# Patient Record
Sex: Male | Born: 1955 | Race: White | Hispanic: No | Marital: Married | Smoking: Former smoker
Health system: Southern US, Community
[De-identification: ages and names within clinical notes are randomized; demographics above are authoritative.]

## PROBLEM LIST (undated history)

## (undated) DIAGNOSIS — N529 Male erectile dysfunction, unspecified: Secondary | ICD-10-CM

## (undated) DIAGNOSIS — K219 Gastro-esophageal reflux disease without esophagitis: Secondary | ICD-10-CM

## (undated) DIAGNOSIS — F419 Anxiety disorder, unspecified: Secondary | ICD-10-CM

## (undated) DIAGNOSIS — I10 Essential (primary) hypertension: Secondary | ICD-10-CM

## (undated) HISTORY — DX: Gastro-esophageal reflux disease without esophagitis: K21.9

## (undated) HISTORY — PX: BACK SURGERY: SHX140

## (undated) HISTORY — DX: Male erectile dysfunction, unspecified: N52.9

## (undated) HISTORY — DX: Essential (primary) hypertension: I10

## (undated) HISTORY — DX: Anxiety disorder, unspecified: F41.9

---

## 2004-06-10 ENCOUNTER — Ambulatory Visit: Payer: Self-pay | Admitting: Unknown Physician Specialty

## 2008-12-04 ENCOUNTER — Ambulatory Visit: Payer: Self-pay | Admitting: Family Medicine

## 2009-03-08 ENCOUNTER — Ambulatory Visit: Payer: Self-pay | Admitting: Family Medicine

## 2009-04-21 ENCOUNTER — Ambulatory Visit: Payer: Self-pay | Admitting: Internal Medicine

## 2010-06-14 IMAGING — CR DG ANKLE COMPLETE 3+V*L*
1 series · 5 of 5 positions shown · non-contrast
Comparison: none

REASON FOR EXAM: r/o fx
COMMENTS:

[Series 1: view not recorded · 0.17mm/px · 5 of 5 slices shown]
[im 1/5]
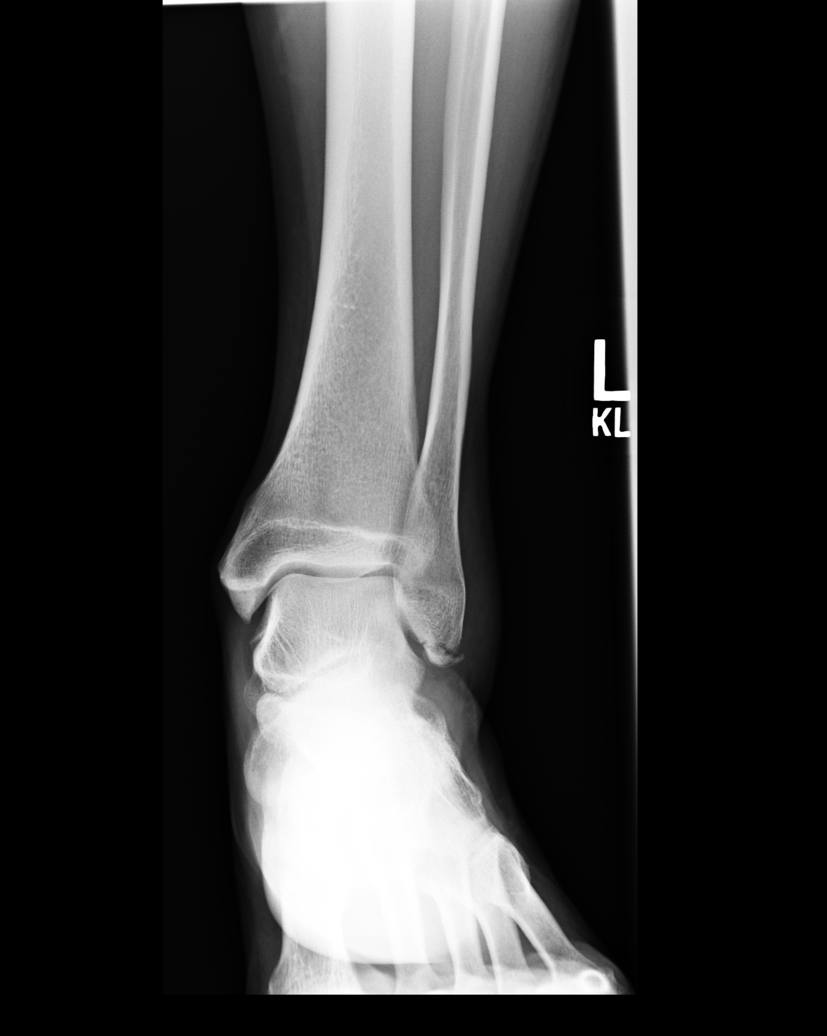
[im 2/5]
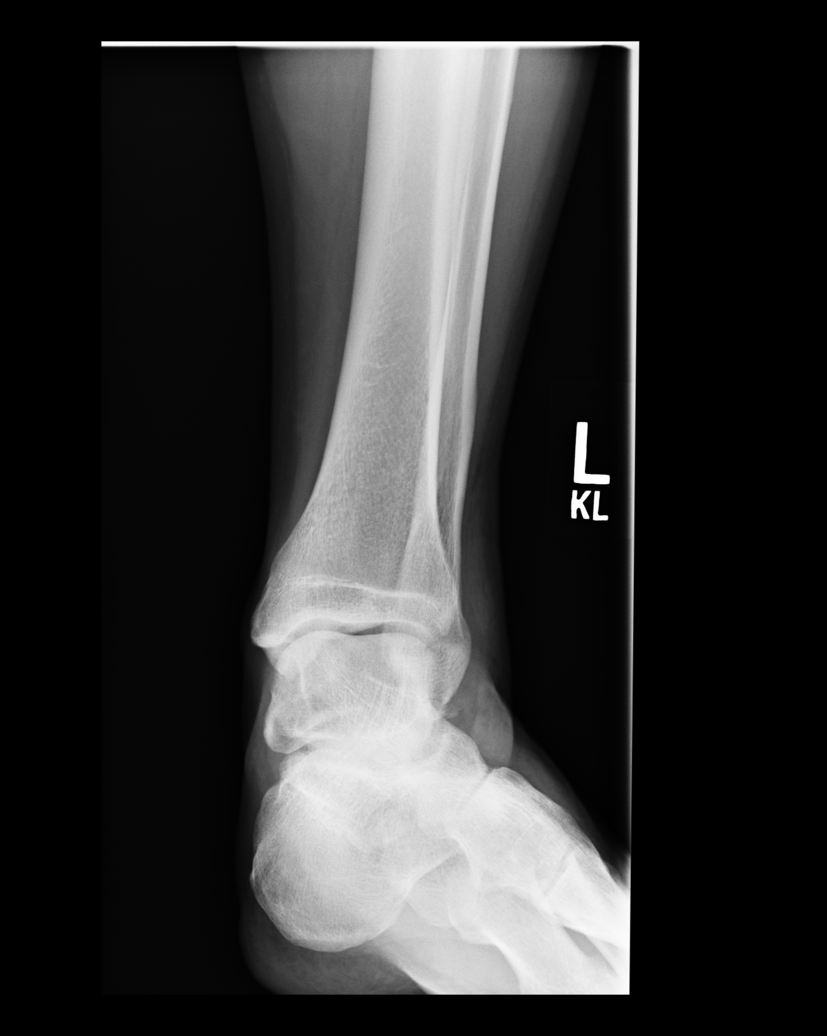
[im 3/5]
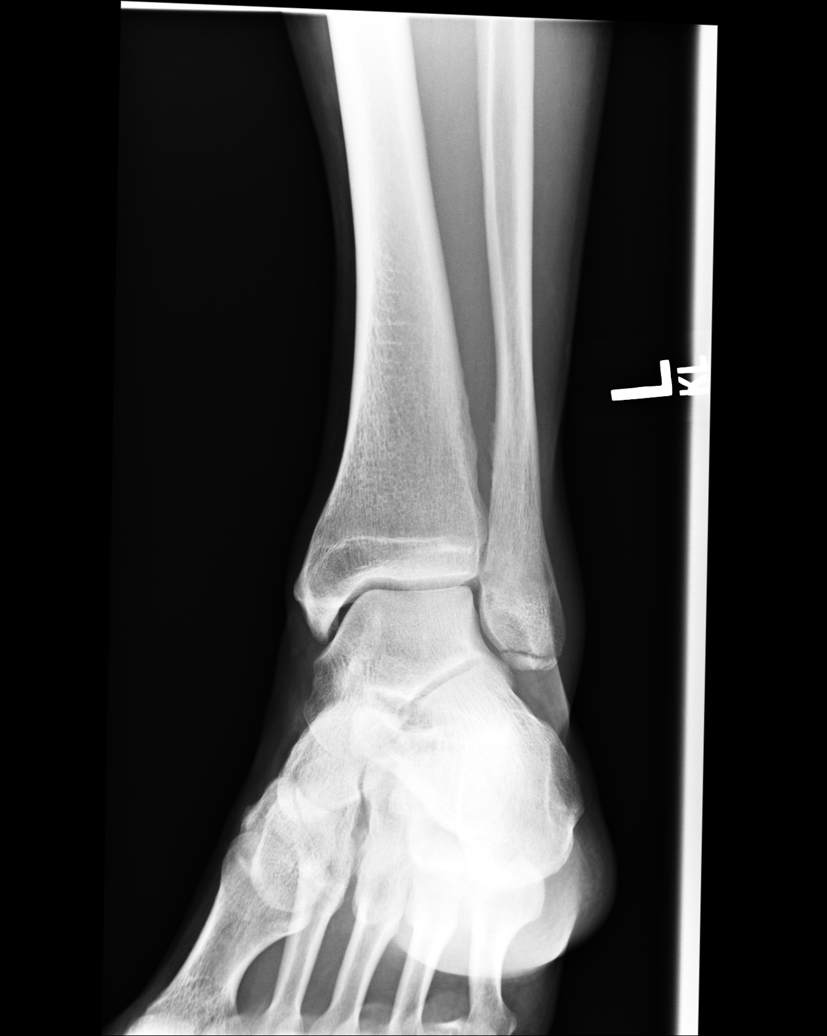
[im 4/5]
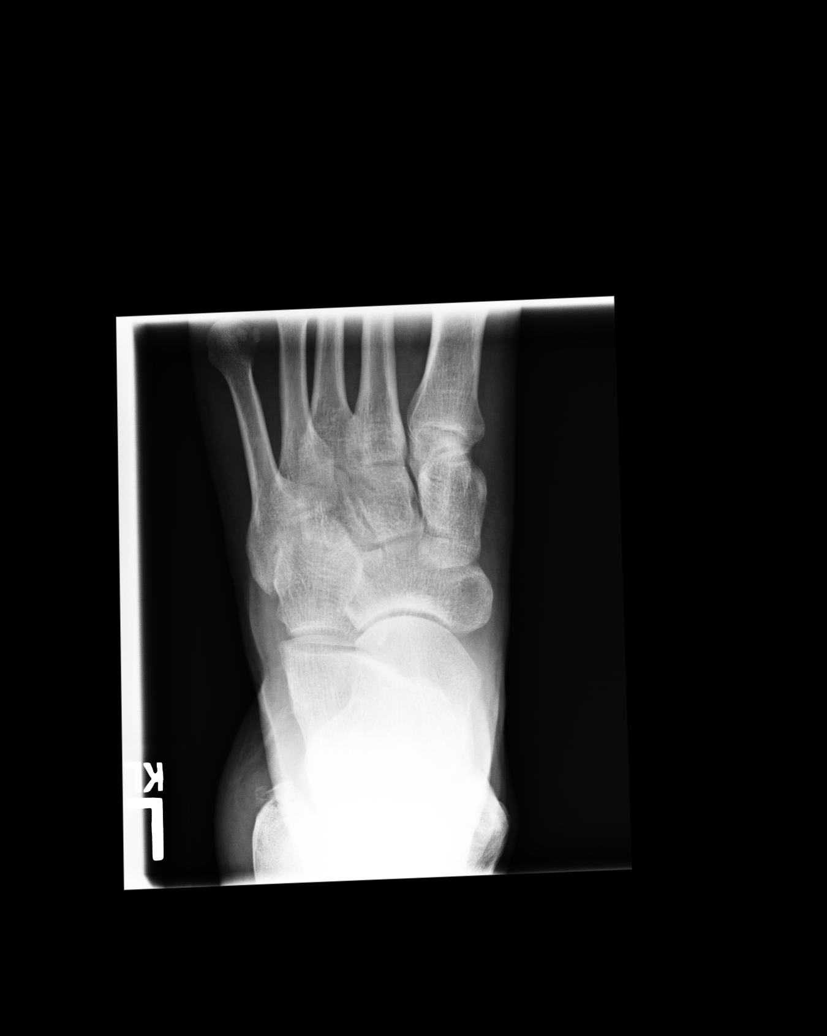
[im 5/5]
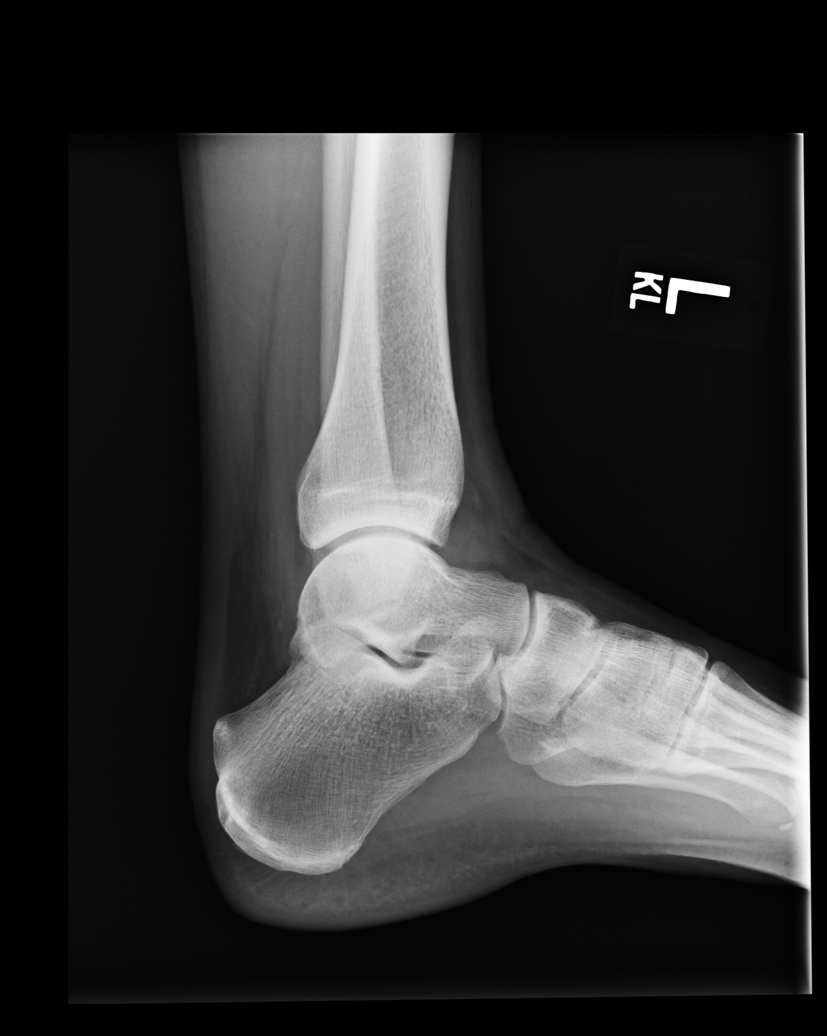

[5 of 5 positions shown; findings below may reference images not displayed]

PROCEDURE:     MDR - MDR ANKLE LEFT COMPLETE  - December 04, 2008 [DATE]

RESULT:     Five views of the left ankle are submitted. There is an acute
fracture through the tip of the lateral malleolus. Distraction of the
fracture fragment by approximately 2 mm is present. There is overlying soft
tissue swelling. The medial and posterior malleolar right are intact. The
ankle joint mortise is preserved and the talar dome is intact.
IMPRESSION: The patient has sustained an acute fracture through the tip
of the lateral malleolus.

## 2011-03-04 HISTORY — PX: COLONOSCOPY: SHX174

## 2011-08-02 LAB — HM COLONOSCOPY

## 2012-01-15 ENCOUNTER — Ambulatory Visit: Payer: Self-pay | Admitting: Unknown Physician Specialty

## 2012-01-19 LAB — PATHOLOGY REPORT

## 2014-11-01 ENCOUNTER — Other Ambulatory Visit: Payer: Self-pay | Admitting: Family Medicine

## 2014-11-01 DIAGNOSIS — N528 Other male erectile dysfunction: Secondary | ICD-10-CM

## 2014-12-14 ENCOUNTER — Encounter: Payer: Self-pay | Admitting: Family Medicine

## 2014-12-14 ENCOUNTER — Ambulatory Visit (INDEPENDENT_AMBULATORY_CARE_PROVIDER_SITE_OTHER): Payer: Commercial Managed Care - PPO | Admitting: Family Medicine

## 2014-12-14 VITALS — BP 100/60 | HR 80 | Ht 70.0 in | Wt 182.0 lb

## 2014-12-14 DIAGNOSIS — K219 Gastro-esophageal reflux disease without esophagitis: Secondary | ICD-10-CM | POA: Diagnosis not present

## 2014-12-14 DIAGNOSIS — Z720 Tobacco use: Secondary | ICD-10-CM | POA: Insufficient documentation

## 2014-12-14 DIAGNOSIS — N528 Other male erectile dysfunction: Secondary | ICD-10-CM

## 2014-12-14 DIAGNOSIS — G8929 Other chronic pain: Secondary | ICD-10-CM | POA: Insufficient documentation

## 2014-12-14 DIAGNOSIS — I10 Essential (primary) hypertension: Secondary | ICD-10-CM

## 2014-12-14 DIAGNOSIS — N529 Male erectile dysfunction, unspecified: Secondary | ICD-10-CM | POA: Diagnosis not present

## 2014-12-14 DIAGNOSIS — L649 Androgenic alopecia, unspecified: Secondary | ICD-10-CM

## 2014-12-14 DIAGNOSIS — Z86010 Personal history of colonic polyps: Secondary | ICD-10-CM | POA: Insufficient documentation

## 2014-12-14 DIAGNOSIS — M549 Dorsalgia, unspecified: Secondary | ICD-10-CM

## 2014-12-14 MED ORDER — LISINOPRIL-HYDROCHLOROTHIAZIDE 10-12.5 MG PO TABS: 1.0000 | ORAL_TABLET | Freq: Every day | ORAL | Status: DC

## 2014-12-14 MED ORDER — OMEPRAZOLE 20 MG PO CPDR: 20.0000 mg | DELAYED_RELEASE_CAPSULE | Freq: Every day | ORAL | Status: DC

## 2014-12-14 MED ORDER — TADALAFIL 20 MG PO TABS: 20.0000 mg | ORAL_TABLET | Freq: Every day | ORAL | Status: DC | PRN

## 2014-12-14 NOTE — Progress Notes (Signed)
Name: Bruce Joseph   MRN: [id number]    DOB: 1955/10/24   Date:12/14/2014       Progress Note  Subjective  Chief Complaint  Chief Complaint  Patient presents with  . Hypertension  . Erectile Dysfunction  . Gastrophageal Reflux    Hypertension This is a chronic problem. The current episode started more than 1 year ago. The problem has been gradually improving since onset. The problem is controlled. Pertinent negatives include no anxiety, blurred vision, chest pain, headaches, malaise/fatigue, neck pain, orthopnea, palpitations, peripheral edema, PND, shortness of breath or sweats. There are no associated agents to hypertension. Risk factors for coronary artery disease include male gender and stress. Past treatments include ACE inhibitors and diuretics. The current treatment provides moderate improvement. There are no compliance problems.  There is no history of angina, kidney disease, CAD/MI, CVA, heart failure, left ventricular hypertrophy, PVD, renovascular disease or retinopathy. There is no history of chronic renal disease.  Erectile Dysfunction This is a chronic problem. The problem has been waxing and waning since onset. The nature of his difficulty is achieving erection and maintaining erection. He reports no anxiety, decreased libido or performance anxiety. Irritative symptoms do not include frequency, nocturia or urgency. Obstructive symptoms do not include dribbling, incomplete emptying, an intermittent stream, a slower stream, straining or a weak stream. Pertinent negatives include no chills, dysuria, genital pain, hematuria, hesitancy or inability to urinate. Nothing aggravates the symptoms. Past treatments include tadalafil. The treatment provided moderate relief.  Gastrophageal Reflux He complains of belching and heartburn. He reports no abdominal pain, no chest pain, no coughing, no dysphagia, no nausea, no sore throat or no wheezing. This is a recurrent problem. The problem occurs  occasionally. The problem has been waxing and waning. The heartburn is of moderate intensity. The symptoms are aggravated by certain foods. Pertinent negatives include no anemia, fatigue, melena, muscle weakness, orthopnea or weight loss. The treatment provided mild relief.    No problem-specific assessment & plan notes found for this encounter.   Past Medical History  Diagnosis Date  . Erectile dysfunction   . GERD (gastroesophageal reflux disease)     Past Surgical History  Procedure Laterality Date  . Back surgery      lower back- disc repair  . Colonoscopy  2013    cleared for 5 yrs- Dr Mechele Collin    Family History  Problem Relation Age of Onset  . Hypertension Mother   . Hypertension Brother   . Hypertension Maternal Grandfather     Social History   Social History  . Marital Status: Married    Spouse Name: N/A  . Number of Children: N/A  . Years of Education: N/A   Occupational History  . Not on file.   Social History Main Topics  . Smoking status: Current Every Day Smoker  . Smokeless tobacco: Not on file  . Alcohol Use: 0.0 oz/week    0 Standard drinks or equivalent per week  . Drug Use: No  . Sexual Activity: Yes   Other Topics Concern  . Not on file   Social History Narrative  . No narrative on file    No Known Allergies   Review of Systems  Constitutional: Negative for fever, chills, weight loss, malaise/fatigue and fatigue.  HENT: Negative for ear discharge, ear pain and sore throat.   Eyes: Negative for blurred vision.  Respiratory: Negative for cough, sputum production, shortness of breath and wheezing.   Cardiovascular: Negative for chest pain, palpitations,  orthopnea, leg swelling and PND.  Gastrointestinal: Positive for heartburn. Negative for dysphagia, nausea, abdominal pain, diarrhea, constipation, blood in stool and melena.  Genitourinary: Negative for dysuria, hesitancy, urgency, frequency, hematuria, decreased libido, incomplete  emptying and nocturia.  Musculoskeletal: Negative for myalgias, back pain, joint pain, muscle weakness and neck pain.  Skin: Negative for rash.  Neurological: Negative for dizziness, tingling, sensory change, focal weakness and headaches.  Endo/Heme/Allergies: Negative for environmental allergies and polydipsia. Does not bruise/bleed easily.  Psychiatric/Behavioral: Negative for depression and suicidal ideas. The patient is not nervous/anxious and does not have insomnia.      Objective  Filed Vitals:   12/14/14 1344  BP: 100/60  Pulse: 80  Height: 5\' 10"  (1.778 m)  Weight: 182 lb (82.555 kg)    Physical Exam  Constitutional: He is oriented to person, place, and time and well-developed, well-nourished, and in no distress.  HENT:  Head: Normocephalic.  Right Ear: External ear normal.  Left Ear: External ear normal.  Nose: Nose normal.  Mouth/Throat: Oropharynx is clear and moist.  Eyes: Conjunctivae and EOM are normal. Pupils are equal, round, and reactive to light. Right eye exhibits no discharge. Left eye exhibits no discharge. No scleral icterus.  Neck: Normal range of motion. Neck supple. No JVD present. No tracheal deviation present. No thyromegaly present.  Cardiovascular: Normal rate, regular rhythm, normal heart sounds and intact distal pulses.  Exam reveals no gallop and no friction rub.   No murmur heard. Pulmonary/Chest: Breath sounds normal. No respiratory distress. He has no wheezes. He has no rales.  Abdominal: Soft. Bowel sounds are normal. He exhibits no mass. There is no hepatosplenomegaly. There is no tenderness. There is no rebound, no guarding and no CVA tenderness.  Musculoskeletal: Normal range of motion. He exhibits no edema or tenderness.  Lymphadenopathy:    He has no cervical adenopathy.  Neurological: He is alert and oriented to person, place, and time. He has normal sensation, normal strength, normal reflexes and intact cranial nerves. No cranial nerve  deficit.  Skin: Skin is warm. No rash noted.  Psychiatric: Mood and affect normal.      Assessment & Plan  Problem List Items Addressed This Visit      Cardiovascular and Mediastinum   Essential hypertension - Primary   Relevant Medications   tadalafil (CIALIS) 20 MG tablet   lisinopril-hydrochlorothiazide (PRINZIDE,ZESTORETIC) 10-12.5 MG tablet   Other Relevant Orders   Renal Function Panel     Digestive   Esophageal reflux   Relevant Medications   omeprazole (PRILOSEC) 20 MG capsule     Endocrine   Male pattern baldness     Genitourinary   Erectile dysfunction   Relevant Medications   tadalafil (CIALIS) 20 MG tablet     Other   Failure of erection   Current tobacco use        Dr. Hayden Rasmusseneanna Jones [facility]  12/14/2014

## 2014-12-15 LAB — RENAL FUNCTION PANEL
ALBUMIN: 4.2 g/dL (ref 3.5–5.5)
BUN / CREAT RATIO: 11 (ref 9–20)
BUN: 9 mg/dL (ref 6–24)
CHLORIDE: 95 mmol/L — AB (ref 97–108)
CO2: 22 mmol/L (ref 18–29)
Calcium: 8.8 mg/dL (ref 8.7–10.2)
Creatinine, Ser: 0.85 mg/dL (ref 0.76–1.27)
GFR, EST AFRICAN AMERICAN: 111 mL/min/{1.73_m2} (ref >59)
GFR, EST NON AFRICAN AMERICAN: 96 mL/min/{1.73_m2} (ref >59)
GLUCOSE: 104 mg/dL — AB (ref 65–99)
POTASSIUM: 3.8 mmol/L (ref 3.5–5.2)
Phosphorus: 3.2 mg/dL (ref 2.5–4.5)
SODIUM: 137 mmol/L (ref 134–144)

## 2015-03-30 ENCOUNTER — Ambulatory Visit (INDEPENDENT_AMBULATORY_CARE_PROVIDER_SITE_OTHER): Payer: Commercial Managed Care - PPO | Admitting: Family Medicine

## 2015-03-30 ENCOUNTER — Encounter: Payer: Self-pay | Admitting: Family Medicine

## 2015-03-30 VITALS — BP 120/98 | HR 80 | Ht 70.0 in | Wt 187.0 lb

## 2015-03-30 DIAGNOSIS — F419 Anxiety disorder, unspecified: Secondary | ICD-10-CM

## 2015-03-30 MED ORDER — BUSPIRONE HCL 7.5 MG PO TABS: 7.5000 mg | ORAL_TABLET | Freq: Two times a day (BID) | ORAL | Status: DC

## 2015-03-30 MED ORDER — ALPRAZOLAM 0.25 MG PO TABS: 0.2500 mg | ORAL_TABLET | Freq: Two times a day (BID) | ORAL | Status: DC | PRN

## 2015-03-30 NOTE — Progress Notes (Signed)
Name: Bruce Joseph   MRN: [id number]    DOB: Dec 19, 1955   Date:03/30/2015       Progress Note  Subjective  Chief Complaint  Chief Complaint  Patient presents with  . Anxiety    get started on something temporarily until getting in with psychiatry    Anxiety Presents for initial visit. Onset was more than 5 years ago. The problem has been gradually worsening. Symptoms include excessive worry, insomnia, nervous/anxious behavior and panic. Patient reports no chest pain, dizziness, nausea, palpitations, shortness of breath or suicidal ideas. Symptoms occur constantly. The severity of symptoms is moderate. The symptoms are aggravated by work stress and family issues. The quality of sleep is poor. Nighttime awakenings: occasional.   There are no known risk factors. There is no history of anemia, anxiety/panic attacks, arrhythmia, asthma, bipolar disorder, CAD, CHF, chronic lung disease, depression, fibromyalgia, hyperthyroidism or suicide attempts. Past treatments include SSRIs. The treatment provided mild relief. Compliance with prior treatments has been good. Side effects of treatment include GI discomfort.    No problem-specific assessment & plan notes found for this encounter.   Past Medical History  Diagnosis Date  . Erectile dysfunction   . GERD (gastroesophageal reflux disease)     Past Surgical History  Procedure Laterality Date  . Back surgery      lower back- disc repair  . Colonoscopy  2013    cleared for 5 yrs- Dr Mechele Collin    Family History  Problem Relation Age of Onset  . Hypertension Mother   . Hypertension Brother   . Hypertension Maternal Grandfather     Social History   Social History  . Marital Status: Married    Spouse Name: N/A  . Number of Children: N/A  . Years of Education: N/A   Occupational History  . Not on file.   Social History Main Topics  . Smoking status: Current Every Day Smoker  . Smokeless tobacco: Not on file  . Alcohol Use: 0.0  oz/week    0 Standard drinks or equivalent per week  . Drug Use: No  . Sexual Activity: Yes   Other Topics Concern  . Not on file   Social History Narrative    No Known Allergies   Review of Systems  Constitutional: Negative for fever, chills, weight loss and malaise/fatigue.  HENT: Negative for ear discharge, ear pain and sore throat.   Eyes: Negative for blurred vision.  Respiratory: Negative for cough, sputum production, shortness of breath and wheezing.   Cardiovascular: Negative for chest pain, palpitations and leg swelling.  Gastrointestinal: Negative for heartburn, nausea, abdominal pain, diarrhea, constipation, blood in stool and melena.  Genitourinary: Negative for dysuria, urgency, frequency and hematuria.  Musculoskeletal: Negative for myalgias, back pain, joint pain and neck pain.  Skin: Negative for rash.  Neurological: Negative for dizziness, tingling, sensory change, focal weakness and headaches.  Endo/Heme/Allergies: Negative for environmental allergies and polydipsia. Does not bruise/bleed easily.  Psychiatric/Behavioral: Negative for depression and suicidal ideas. The patient is nervous/anxious and has insomnia.      Objective  Filed Vitals:   03/30/15 1032  BP: 120/98  Pulse: 80  Height:  (1.778 m)  Weight: 187 lb (84.823 kg)    Physical Exam  Constitutional: He is oriented to person, place, and time and well-developed, well-nourished, and in no distress.  HENT:  Head: Normocephalic.  Right Ear: External ear normal.  Left Ear: External ear normal.  Nose: Nose normal.  Mouth/Throat: Oropharynx is clear  and moist.  Eyes: Conjunctivae and EOM are normal. Pupils are equal, round, and reactive to light. Right eye exhibits no discharge. Left eye exhibits no discharge. No scleral icterus.  Neck: Normal range of motion. Neck supple. No JVD present. No tracheal deviation present. No thyromegaly present.  Cardiovascular: Normal rate, regular rhythm,  normal heart sounds and intact distal pulses.  Exam reveals no gallop and no friction rub.   No murmur heard. Pulmonary/Chest: Breath sounds normal. No respiratory distress. He has no wheezes. He has no rales.  Abdominal: Soft. Bowel sounds are normal. He exhibits no mass. There is no hepatosplenomegaly. There is no tenderness. There is no rebound, no guarding and no CVA tenderness.  Musculoskeletal: Normal range of motion. He exhibits no edema or tenderness.  Lymphadenopathy:    He has no cervical adenopathy.  Neurological: He is alert and oriented to person, place, and time. He has normal sensation, normal strength, normal reflexes and intact cranial nerves. No cranial nerve deficit.  Skin: Skin is warm. No rash noted.  Psychiatric: Mood and affect normal.  Nursing note and vitals reviewed.     Assessment & Plan  Problem List Items Addressed This Visit    None    Visit Diagnoses    Chronic anxiety    -  Primary    referral psychiatry    Relevant Medications    busPIRone (BUSPAR) 7.5 MG tablet    ALPRAZolam (XANAX) 0.25 MG tablet      Gave information to pt for [employer] in [place]   Dr. Hayden [facility]  03/30/2015

## 2015-05-23 ENCOUNTER — Other Ambulatory Visit: Payer: Self-pay | Admitting: Family Medicine

## 2015-06-14 ENCOUNTER — Ambulatory Visit (INDEPENDENT_AMBULATORY_CARE_PROVIDER_SITE_OTHER): Payer: Commercial Managed Care - PPO | Admitting: Family Medicine

## 2015-06-14 ENCOUNTER — Other Ambulatory Visit: Payer: Self-pay | Admitting: Family Medicine

## 2015-06-14 ENCOUNTER — Encounter: Payer: Self-pay | Admitting: Family Medicine

## 2015-06-14 VITALS — BP 120/80 | HR 80 | Ht 70.0 in | Wt 181.0 lb

## 2015-06-14 DIAGNOSIS — K219 Gastro-esophageal reflux disease without esophagitis: Secondary | ICD-10-CM | POA: Diagnosis not present

## 2015-06-14 DIAGNOSIS — Z720 Tobacco use: Secondary | ICD-10-CM | POA: Diagnosis not present

## 2015-06-14 DIAGNOSIS — F419 Anxiety disorder, unspecified: Secondary | ICD-10-CM

## 2015-06-14 DIAGNOSIS — Z0000 Encounter for general adult medical examination without abnormal findings: Secondary | ICD-10-CM

## 2015-06-14 DIAGNOSIS — N400 Enlarged prostate without lower urinary tract symptoms: Secondary | ICD-10-CM | POA: Diagnosis not present

## 2015-06-14 DIAGNOSIS — N528 Other male erectile dysfunction: Secondary | ICD-10-CM | POA: Diagnosis not present

## 2015-06-14 DIAGNOSIS — I10 Essential (primary) hypertension: Secondary | ICD-10-CM | POA: Diagnosis not present

## 2015-06-14 DIAGNOSIS — E785 Hyperlipidemia, unspecified: Secondary | ICD-10-CM | POA: Diagnosis not present

## 2015-06-14 MED ORDER — OMEPRAZOLE 20 MG PO CPDR: 20.0000 mg | DELAYED_RELEASE_CAPSULE | Freq: Every day | ORAL | Status: DC

## 2015-06-14 MED ORDER — TADALAFIL 20 MG PO TABS: 20.0000 mg | ORAL_TABLET | Freq: Every day | ORAL | Status: DC | PRN

## 2015-06-14 MED ORDER — LISINOPRIL-HYDROCHLOROTHIAZIDE 10-12.5 MG PO TABS: 1.0000 | ORAL_TABLET | Freq: Every day | ORAL | Status: DC

## 2015-06-14 MED ORDER — BUSPIRONE HCL 7.5 MG PO TABS: ORAL_TABLET | ORAL | Status: DC

## 2015-06-14 NOTE — Progress Notes (Signed)
Name: Bruce Joseph   MRN: [id number]    DOB: [birth year]   Date:06/14/2015       Progress Note  Subjective  Chief Complaint  Chief Complaint  Patient presents with  . Annual Exam  . Hypertension  . Gastroesophageal Reflux  . Anxiety  . Erectile Dysfunction    HPI Comments: Patient presents for annual physical exam.  Hypertension This is a chronic problem. The current episode started more than 1 year ago. The problem has been gradually improving since onset. The problem is controlled. Associated symptoms include anxiety. Pertinent negatives include no blurred vision, chest pain, headaches, malaise/fatigue, neck pain, orthopnea, palpitations, peripheral edema, PND, shortness of breath or sweats. There are no associated agents to hypertension. There are no known risk factors for coronary artery disease. Past treatments include diuretics and ACE inhibitors. The current treatment provides moderate improvement. There are no compliance problems.  There is no history of angina, kidney disease, CAD/MI, CVA, heart failure, left ventricular hypertrophy, PVD, renovascular disease or retinopathy. There is no history of chronic renal disease or a hypertension causing med.  Gastroesophageal Reflux He reports no abdominal pain, no belching, no chest pain, no choking, no coughing, no dysphagia, no early satiety, no globus sensation, no heartburn, no hoarse voice, no nausea, no sore throat, no stridor, no tooth decay, no water brash or no wheezing. This is a chronic problem. The problem occurs occasionally. The problem has been gradually improving. The symptoms are aggravated by certain foods. Pertinent negatives include no anemia, fatigue, melena, muscle weakness, orthopnea or weight loss. Risk factors include smoking/tobacco exposure. He has tried a PPI for the symptoms. The treatment provided moderate relief.  Anxiety Presents for follow-up visit. The problem has been gradually improving. Symptoms include  nervous/anxious behavior. Patient reports no chest pain, compulsions, confusion, decreased concentration, depressed mood, dizziness, dry mouth, excessive worry, feeling of choking, hyperventilation, impotence, insomnia, irritability, malaise, muscle tension, nausea, obsessions, palpitations, panic, restlessness, shortness of breath or suicidal ideas. Symptoms occur occasionally. The severity of symptoms is mild. Nothing aggravates the symptoms. The quality of sleep is good.    Erectile Dysfunction This is a chronic problem. The problem has been waxing and waning since onset. Non-physiologic factors contributing to erectile dysfunction are anxiety. Irritative symptoms do not include frequency or urgency. Obstructive symptoms do not include dribbling, incomplete emptying, an intermittent stream, a slower stream, straining or a weak stream. Pertinent negatives include no chills, dysuria or hematuria. Nothing aggravates the symptoms. Past treatments include vardenafil.    No problem-specific assessment & plan notes found for this encounter.   Past Medical History  Diagnosis Date  . Erectile dysfunction   . GERD (gastroesophageal reflux disease)   . Hypertension   . Anxiety     Past Surgical History  Procedure Laterality Date  . Back surgery      lower back- disc repair  . Colonoscopy  2013    cleared for 5 yrs- Dr Mechele Collin    Family History  Problem Relation Age of Onset  . Hypertension Mother   . Hypertension Brother   . Hypertension Maternal Grandfather     Social History   Social History  . Marital Status: Married    Spouse Name: N/A  . Number of Children: N/A  . Years of Education: N/A   Occupational History  . Not on file.   Social History Main Topics  . Smoking status: Current Every Day Smoker  . Smokeless tobacco: Not on file  . Alcohol  Use: 0.0 oz/week    0 Standard drinks or equivalent per week  . Drug Use: No  . Sexual Activity: Yes   Other Topics Concern  .  Not on file   Social History Narrative    No Known Allergies   Review of Systems  Constitutional: Negative for fever, chills, weight loss, malaise/fatigue, irritability and fatigue.  HENT: Negative for ear discharge, ear pain, hoarse voice and sore throat.   Eyes: Negative for blurred vision.  Respiratory: Negative for cough, sputum production, choking, shortness of breath and wheezing.   Cardiovascular: Negative for chest pain, palpitations, orthopnea, leg swelling and PND.  Gastrointestinal: Negative for heartburn, dysphagia, nausea, abdominal pain, diarrhea, constipation, blood in stool and melena.  Genitourinary: Negative for dysuria, urgency, frequency, hematuria, impotence and incomplete emptying.  Musculoskeletal: Negative for myalgias, back pain, joint pain, muscle weakness and neck pain.  Skin: Negative for rash.  Neurological: Negative for dizziness, tingling, sensory change, focal weakness and headaches.  Endo/Heme/Allergies: Negative for environmental allergies and polydipsia. Does not bruise/bleed easily.  Psychiatric/Behavioral: Negative for depression, suicidal ideas, confusion and decreased concentration. The patient is nervous/anxious. The patient does not have insomnia.      Objective  Filed Vitals:   06/14/15 0904  BP: 120/80  Pulse: 80  Height: 5\' 10"  (1.778 m)  Weight: 181 lb (82.101 kg)    Physical Exam  Constitutional: He is oriented to person, place, and time and well-developed, well-nourished, and in no distress.  HENT:  Head: Normocephalic.  Right Ear: External ear normal.  Left Ear: External ear normal.  Nose: Nose normal.  Mouth/Throat: Oropharynx is clear and moist.  Eyes: Conjunctivae and EOM are normal. Pupils are equal, round, and reactive to light. Right eye exhibits no discharge. Left eye exhibits no discharge. No scleral icterus.  Neck: Normal range of motion. Neck supple. No JVD present. No tracheal deviation present. No thyromegaly  present.  Cardiovascular: Normal rate, regular rhythm, normal heart sounds and intact distal pulses.  Exam reveals no gallop and no friction rub.   No murmur heard. Pulmonary/Chest: Breath sounds normal. No respiratory distress. He has no wheezes. He has no rales.  Abdominal: Soft. Bowel sounds are normal. He exhibits no mass. There is no hepatosplenomegaly. There is no tenderness. There is no rebound, no guarding and no CVA tenderness.  Genitourinary: Rectum normal, prostate normal, testes/scrotum normal and penis normal. Guaiac negative stool.  Musculoskeletal: Normal range of motion. He exhibits no edema or tenderness.  Lymphadenopathy:    He has no cervical adenopathy.  Neurological: He is alert and oriented to person, place, and time. He has normal sensation, normal strength, normal reflexes and intact cranial nerves. No cranial nerve deficit.  Skin: Skin is warm. No rash noted.  Psychiatric: Mood and affect normal.  Nursing note and vitals reviewed.     Assessment & Plan  Problem List Items Addressed This Visit      Cardiovascular and Mediastinum   Essential hypertension   Relevant Medications   lisinopril-hydrochlorothiazide (PRINZIDE,ZESTORETIC) 10-12.5 MG tablet   tadalafil (CIALIS) 20 MG tablet   Other Relevant Orders   Renal Function Panel     Digestive   Esophageal reflux   Relevant Medications   omeprazole (PRILOSEC) 20 MG capsule     Genitourinary   Erectile dysfunction   Relevant Medications   tadalafil (CIALIS) 20 MG tablet     Other   Current tobacco use    Other Visit Diagnoses    Annual physical exam    -  Primary    BPH (benign prostatic hypertrophy)        Relevant Orders    PSA    Chronic anxiety        Relevant Medications    busPIRone (BUSPAR) 7.5 MG tablet    Hyperlipidemia        Relevant Medications    lisinopril-hydrochlorothiazide (PRINZIDE,ZESTORETIC) 10-12.5 MG tablet    tadalafil (CIALIS) 20 MG tablet    Other Relevant Orders     Lipid Profile         Dr. Elizabeth Sauer [facility]  06/14/2015

## 2015-06-15 LAB — RENAL FUNCTION PANEL
Albumin: 4.4 g/dL (ref 3.5–5.5)
BUN / CREAT RATIO: 11 (ref 9–20)
BUN: 10 mg/dL (ref 6–24)
CALCIUM: 8.8 mg/dL (ref 8.7–10.2)
CO2: 23 mmol/L (ref 18–29)
CREATININE: 0.94 mg/dL (ref 0.76–1.27)
Chloride: 96 mmol/L (ref 96–106)
GFR calc Af Amer: 103 mL/min/{1.73_m2} (ref >59)
GFR calc non Af Amer: 89 mL/min/{1.73_m2} (ref >59)
Glucose: 78 mg/dL (ref 65–99)
Phosphorus: 2.5 mg/dL (ref 2.5–4.5)
Potassium: 4.2 mmol/L (ref 3.5–5.2)
Sodium: 137 mmol/L (ref 134–144)

## 2015-06-15 LAB — LIPID PANEL
CHOL/HDL RATIO: 4 ratio (ref 0.0–5.0)
Cholesterol, Total: 206 mg/dL — ABNORMAL HIGH (ref 100–199)
HDL: 52 mg/dL (ref >39)
LDL Calculated: 120 mg/dL — ABNORMAL HIGH (ref 0–99)
TRIGLYCERIDES: 170 mg/dL — AB (ref 0–149)
VLDL CHOLESTEROL CAL: 34 mg/dL (ref 5–40)

## 2015-06-15 LAB — PSA: Prostate Specific Ag, Serum: 0.3 ng/mL (ref 0.0–4.0)

## 2015-09-17 ENCOUNTER — Telehealth: Payer: Self-pay

## 2015-09-17 DIAGNOSIS — I10 Essential (primary) hypertension: Secondary | ICD-10-CM

## 2015-09-17 DIAGNOSIS — K219 Gastro-esophageal reflux disease without esophagitis: Secondary | ICD-10-CM

## 2015-09-17 MED ORDER — OMEPRAZOLE 20 MG PO CPDR: 20.0000 mg | DELAYED_RELEASE_CAPSULE | Freq: Every day | ORAL | Status: DC

## 2015-09-17 MED ORDER — LISINOPRIL-HYDROCHLOROTHIAZIDE 10-12.5 MG PO TABS: 1.0000 | ORAL_TABLET | Freq: Every day | ORAL | Status: DC

## 2015-09-17 NOTE — Telephone Encounter (Signed)
90 supply requested, refilled and sent to the pharmacy.

## 2015-09-26 ENCOUNTER — Other Ambulatory Visit: Payer: Self-pay | Admitting: Family Medicine

## 2015-10-29 ENCOUNTER — Other Ambulatory Visit: Payer: Self-pay | Admitting: Family Medicine

## 2015-11-30 ENCOUNTER — Other Ambulatory Visit: Payer: Self-pay | Admitting: Family Medicine

## 2016-03-11 ENCOUNTER — Ambulatory Visit: Payer: Commercial Managed Care - PPO | Admitting: Family Medicine

## 2016-03-11 ENCOUNTER — Encounter: Payer: Self-pay | Admitting: Family Medicine

## 2016-03-11 ENCOUNTER — Ambulatory Visit (INDEPENDENT_AMBULATORY_CARE_PROVIDER_SITE_OTHER): Payer: Commercial Managed Care - PPO | Admitting: Family Medicine

## 2016-03-11 VITALS — BP 130/70 | HR 80 | Temp 97.8°F | Ht 70.0 in | Wt 192.0 lb

## 2016-03-11 DIAGNOSIS — J40 Bronchitis, not specified as acute or chronic: Secondary | ICD-10-CM

## 2016-03-11 DIAGNOSIS — J0100 Acute maxillary sinusitis, unspecified: Secondary | ICD-10-CM

## 2016-03-11 MED ORDER — AMOXICILLIN 500 MG PO CAPS: 500.0000 mg | ORAL_CAPSULE | Freq: Three times a day (TID) | ORAL | 0 refills | Status: DC

## 2016-03-11 MED ORDER — GUAIFENESIN-CODEINE 100-10 MG/5ML PO SYRP: 5.0000 mL | ORAL_SOLUTION | Freq: Three times a day (TID) | ORAL | 0 refills | Status: DC | PRN

## 2016-03-11 NOTE — Progress Notes (Signed)
Name: Brita RompRennie J Viglione   MRN: [id number]    DOB: 1955/09/22   Date:03/11/2016       Progress Note  Subjective  Chief Complaint  Chief Complaint  Patient presents with  . Sinusitis    dry cough with some green production, facial pressure and L) ear pain    Sinusitis  This is a chronic problem. The current episode started in the past 7 days. The problem has been gradually worsening since onset. The maximum temperature recorded prior to his arrival was 100.4 - 100.9 F. He is experiencing no pain. Associated symptoms include chills, congestion, coughing, diaphoresis, headaches, sinus pressure, sneezing and a sore throat. Pertinent negatives include no ear pain, hoarse voice, neck pain, shortness of breath or swollen glands. Past treatments include acetaminophen and oral decongestants. The treatment provided no relief.  Cough  This is a chronic problem. The current episode started in the past 7 days. The problem has been waxing and waning. The cough is productive of purulent sputum. Associated symptoms include chills, headaches, nasal congestion, postnasal drip, rhinorrhea and a sore throat. Pertinent negatives include no chest pain, ear pain, fever, heartburn, myalgias, rash, shortness of breath, weight loss or wheezing. The treatment provided mild relief. There is no history of environmental allergies.    No problem-specific Assessment & Plan notes found for this encounter.   Past Medical History:  Diagnosis Date  . Anxiety   . Erectile dysfunction   . GERD (gastroesophageal reflux disease)   . Hypertension     Past Surgical History:  Procedure Laterality Date  . BACK SURGERY     lower back- disc repair  . COLONOSCOPY  2013   cleared for 5 yrs- Dr Mechele CollinElliott    Family History  Problem Relation Age of Onset  . Hypertension Mother   . Hypertension Brother   . Hypertension Maternal Grandfather     Social History   Social History  . Marital status: Married    Spouse name: N/A  .  Number of children: N/A  . Years of education: N/A   Occupational History  . Not on file.   Social History Main Topics  . Smoking status: Current Every Day Smoker  . Smokeless tobacco: Not on file  . Alcohol use 0.0 oz/week  . Drug use: No  . Sexual activity: Yes   Other Topics Concern  . Not on file   Social History Narrative  . No narrative on file    No Known Allergies   Review of Systems  Constitutional: Positive for chills and diaphoresis. Negative for fever, malaise/fatigue and weight loss.  HENT: Positive for congestion, postnasal drip, rhinorrhea, sinus pressure, sneezing and sore throat. Negative for ear discharge, ear pain and hoarse voice.   Eyes: Negative for blurred vision.  Respiratory: Positive for cough. Negative for sputum production, shortness of breath and wheezing.   Cardiovascular: Negative for chest pain, palpitations and leg swelling.  Gastrointestinal: Negative for abdominal pain, blood in stool, constipation, diarrhea, heartburn, melena and nausea.  Genitourinary: Negative for dysuria, frequency, hematuria and urgency.  Musculoskeletal: Negative for back pain, joint pain, myalgias and neck pain.  Skin: Negative for rash.  Neurological: Positive for headaches. Negative for dizziness, tingling, sensory change and focal weakness.  Endo/Heme/Allergies: Negative for environmental allergies and polydipsia. Does not bruise/bleed easily.  Psychiatric/Behavioral: Negative for depression and suicidal ideas. The patient is not nervous/anxious and does not have insomnia.      Objective  Vitals:   03/11/16 1059  BP:  130/70  Pulse: 80  Temp: 97.8 F (36.6 C)  Weight: 192 lb (87.1 kg)  Height: 5\' 10"  (1.778 m)    Physical Exam  Constitutional: He is oriented to person, place, and time and well-developed, well-nourished, and in no distress.  HENT:  Head: Normocephalic.  Right Ear: External ear normal.  Left Ear: External ear normal.  Nose: Nose  normal.  Mouth/Throat: Oropharynx is clear and moist.  Eyes: Conjunctivae and EOM are normal. Pupils are equal, round, and reactive to light. Right eye exhibits no discharge. Left eye exhibits no discharge. No scleral icterus.  Neck: Normal range of motion. Neck supple. No JVD present. No tracheal deviation present. No thyromegaly present.  Cardiovascular: Normal rate, regular rhythm, normal heart sounds and intact distal pulses.  Exam reveals no gallop and no friction rub.   No murmur heard. Pulmonary/Chest: Breath sounds normal. No respiratory distress. He has no wheezes. He has no rales.  Abdominal: Soft. Bowel sounds are normal. He exhibits no mass. There is no hepatosplenomegaly. There is no tenderness. There is no rebound, no guarding and no CVA tenderness.  Musculoskeletal: Normal range of motion. He exhibits no edema or tenderness.  Lymphadenopathy:    He has no cervical adenopathy.  Neurological: He is alert and oriented to person, place, and time. He has normal sensation, normal strength, normal reflexes and intact cranial nerves. No cranial nerve deficit.  Skin: Skin is warm. No rash noted.  Psychiatric: Mood and affect normal.  Nursing note and vitals reviewed.     Assessment & Plan  Problem List Items Addressed This Visit    None    Visit Diagnoses    Acute maxillary sinusitis, recurrence not specified    -  Primary   Relevant Medications   amoxicillin (AMOXIL) 500 MG capsule   guaiFENesin-codeine (ROBITUSSIN AC) 100-10 MG/5ML syrup   Bronchitis       Relevant Medications   amoxicillin (AMOXIL) 500 MG capsule        Dr. [facility]  03/11/16

## 2016-03-14 ENCOUNTER — Other Ambulatory Visit: Payer: Self-pay | Admitting: Family Medicine

## 2016-03-14 DIAGNOSIS — I10 Essential (primary) hypertension: Secondary | ICD-10-CM

## 2016-03-17 ENCOUNTER — Other Ambulatory Visit: Payer: Self-pay | Admitting: Family Medicine

## 2016-03-17 DIAGNOSIS — K219 Gastro-esophageal reflux disease without esophagitis: Secondary | ICD-10-CM

## 2016-03-20 ENCOUNTER — Other Ambulatory Visit: Payer: Self-pay | Admitting: Family Medicine

## 2016-03-28 ENCOUNTER — Other Ambulatory Visit: Payer: Self-pay

## 2016-03-28 DIAGNOSIS — J0100 Acute maxillary sinusitis, unspecified: Secondary | ICD-10-CM

## 2016-03-28 DIAGNOSIS — J40 Bronchitis, not specified as acute or chronic: Secondary | ICD-10-CM

## 2016-03-28 MED ORDER — AMOXICILLIN 500 MG PO CAPS: 500.0000 mg | ORAL_CAPSULE | Freq: Three times a day (TID) | ORAL | 0 refills | Status: DC

## 2016-05-10 ENCOUNTER — Other Ambulatory Visit: Payer: Self-pay | Admitting: Family Medicine

## 2016-05-31 ENCOUNTER — Other Ambulatory Visit: Payer: Self-pay | Admitting: Family Medicine

## 2016-05-31 DIAGNOSIS — K219 Gastro-esophageal reflux disease without esophagitis: Secondary | ICD-10-CM

## 2016-06-11 ENCOUNTER — Other Ambulatory Visit: Payer: Self-pay | Admitting: Family Medicine

## 2016-06-11 DIAGNOSIS — I10 Essential (primary) hypertension: Secondary | ICD-10-CM

## 2016-07-11 ENCOUNTER — Other Ambulatory Visit: Payer: Self-pay

## 2016-07-22 ENCOUNTER — Other Ambulatory Visit: Payer: Self-pay

## 2016-08-06 ENCOUNTER — Encounter: Payer: Self-pay | Admitting: Family Medicine

## 2016-08-06 ENCOUNTER — Ambulatory Visit (INDEPENDENT_AMBULATORY_CARE_PROVIDER_SITE_OTHER): Payer: Commercial Managed Care - PPO | Admitting: Family Medicine

## 2016-08-06 VITALS — BP 120/80 | HR 76 | Ht 70.0 in | Wt 184.0 lb

## 2016-08-06 DIAGNOSIS — Z0000 Encounter for general adult medical examination without abnormal findings: Secondary | ICD-10-CM | POA: Diagnosis not present

## 2016-08-06 DIAGNOSIS — S41111A Laceration without foreign body of right upper arm, initial encounter: Secondary | ICD-10-CM

## 2016-08-06 DIAGNOSIS — Z23 Encounter for immunization: Secondary | ICD-10-CM | POA: Diagnosis not present

## 2016-08-06 DIAGNOSIS — R69 Illness, unspecified: Secondary | ICD-10-CM

## 2016-08-06 LAB — POCT URINALYSIS DIPSTICK
Bilirubin, UA: NEGATIVE
Blood, UA: NEGATIVE
GLUCOSE UA: NEGATIVE
Ketones, UA: NEGATIVE
LEUKOCYTES UA: NEGATIVE
NITRITE UA: NEGATIVE
Protein, UA: NEGATIVE
Spec Grav, UA: 1.01 (ref 1.010–1.025)
UROBILINOGEN UA: 0.2 U/dL
pH, UA: 5 (ref 5.0–8.0)

## 2016-08-06 LAB — HEMOCCULT GUIAC POC 1CARD (OFFICE): FECAL OCCULT BLD: NEGATIVE

## 2016-08-06 NOTE — Progress Notes (Signed)
Name: Bruce Joseph   MRN: [id number]    DOB: [birth year]   Date:08/06/2016       Progress Note  Subjective  Chief Complaint  Chief Complaint  Patient presents with (61y)  . Annual Exam    up to date on colon- needs TDAP    Patient presents for annual physical exam. (61y)     No problem-specific Assessment & Plan notes found for this encounter.   Past Medical History:  Diagnosis Date  . Anxiety   . Erectile dysfunction   . GERD (gastroesophageal reflux disease)   . Hypertension     Past Surgical History:  Procedure Laterality Date  . BACK SURGERY     lower back- disc repair  . COLONOSCOPY  2013   cleared for 5 yrs- Dr Mechele Collin    Family History  Problem Relation Age of Onset  . Hypertension Mother   . Hypertension Brother   . Hypertension Maternal Grandfather     Social History   Social History  . Marital status: Married    Spouse name: N/A  . Number of children: N/A  . Years of education: N/A   Occupational History  . Not on file.   Social History Main Topics  . Smoking status: Current Every Day Smoker    Types: Cigarettes  . Smokeless tobacco: Former Neurosurgeon     Comment: gave info on smoking cessation options  . Alcohol use 0.0 oz/week  . Drug use: No  . Sexual activity: Yes   Other Topics Concern  . Not on file   Social History Narrative  . No narrative on file    No Known Allergies  Outpatient Medications Prior to Visit  Medication Sig Dispense Refill  . finasteride (PROSCAR) 5 MG tablet TAKE 1 TABLET BY MOUTH EVERY DAY 30 tablet 1  . lisinopril-hydrochlorothiazide (PRINZIDE,ZESTORETIC) 10-12.5 MG tablet TAKE 1 TABLET BY MOUTH DAILY. 30 tablet 0  . Multiple Vitamin (MULTI-VITAMINS) TABS Take 1 tablet by mouth daily.    Marland Kitchen omeprazole (PRILOSEC) 20 MG capsule TAKE 1 CAPSULE (20 MG TOTAL) BY MOUTH DAILY. 90 capsule 0  . tadalafil (CIALIS) 20 MG tablet Take 1 tablet (20 mg total) by mouth daily as needed. 5 tablet 11  . amoxicillin (AMOXIL) 500 MG capsule  Take 1 capsule (500 mg total) by mouth 3 (three) times daily. 30 capsule 0  . guaiFENesin-codeine (ROBITUSSIN AC) 100-10 MG/5ML syrup Take 5 mLs by mouth 3 (three) times daily as needed for cough. 150 mL 0   No facility-administered medications prior to visit.     Review of Systems  Constitutional: Negative for chills, fever, malaise/fatigue and weight loss.       Weight gain  HENT: Negative for ear discharge, ear pain and sore throat.   Eyes: Negative for blurred vision.  Respiratory: Negative for cough, sputum production, shortness of breath and wheezing.   Cardiovascular: Negative for chest pain, palpitations and leg swelling.  Gastrointestinal: Negative for abdominal pain, blood in stool, constipation, diarrhea, heartburn, melena and nausea.  Genitourinary: Negative for dysuria, frequency, hematuria and urgency.       Nocturia  Musculoskeletal: Negative for back pain, joint pain, myalgias and neck pain.  Skin: Negative for rash.  Neurological: Negative for dizziness, tingling, sensory change, focal weakness and headaches.  Endo/Heme/Allergies: Negative for environmental allergies and polydipsia. Does not bruise/bleed easily.  Psychiatric/Behavioral: Negative for depression and suicidal ideas. The patient is not nervous/anxious and does not have insomnia.      Objective  Vitals:  08/06/16 0836  BP: 120/80  Pulse: 76  Weight: 184 lb (83.5 kg)  Height: 5\' 10"  (1.778 m)    Physical Exam  Constitutional: He is oriented to person, place, and time and well-developed, well-nourished, and in no distress.  HENT:  Head: Normocephalic and atraumatic.  Right Ear: Tympanic membrane, external ear and ear canal normal.  Left Ear: Tympanic membrane, external ear and ear canal normal.  Nose: Nose normal.  Mouth/Throat: Uvula is midline, oropharynx is clear and moist and mucous membranes are normal. No oropharyngeal exudate, posterior oropharyngeal edema, posterior oropharyngeal erythema  or tonsillar abscesses.  Eyes: Conjunctivae, EOM and lids are normal. Pupils are equal, round, and reactive to light. Right eye exhibits no discharge. Left eye exhibits no discharge. No scleral icterus.  Fundoscopic exam:      The right eye shows no arteriolar narrowing, no AV nicking and no papilledema.       The left eye shows no arteriolar narrowing, no AV nicking and no papilledema.  Neck: Trachea normal and normal range of motion. Neck supple. Normal carotid pulses, no hepatojugular reflux and no JVD present. Carotid bruit is not present. No tracheal deviation present. No thyromegaly present.  Cardiovascular: Normal rate, regular rhythm, S1 normal, S2 normal, normal heart sounds, intact distal pulses and normal pulses.  PMI is not displaced.  Exam reveals no gallop, no S3, no S4 and no friction rub.   No murmur heard. Pulmonary/Chest: Effort normal and breath sounds normal. No respiratory distress. He has no wheezes. He has no rales. Right breast exhibits no inverted nipple, no mass, no nipple discharge, no skin change and no tenderness. Left breast exhibits no inverted nipple, no mass, no nipple discharge, no skin change and no tenderness. Breasts are symmetrical.  Abdominal: Soft. Normal aorta and bowel sounds are normal. He exhibits no mass. There is no hepatosplenomegaly. There is no tenderness. There is no rebound, no guarding and no CVA tenderness.  Genitourinary: Rectum normal, prostate normal, testes/scrotum normal and penis normal.  Musculoskeletal: Normal range of motion. He exhibits no edema or tenderness.       Cervical back: Normal.       Thoracic back: Normal.       Lumbar back: Normal.  Lymphadenopathy:       Head (right side): No submental and no submandibular adenopathy present.       Head (left side): No submental and no submandibular adenopathy present.    He has no cervical adenopathy.    He has no axillary adenopathy.  Neurological: He is alert and oriented to person,  place, and time. He has normal sensation, normal strength, normal reflexes and intact cranial nerves. No cranial nerve deficit.  Skin: Skin is warm and intact. No rash noted.  Psychiatric: Mood and affect normal.  Nursing note and vitals reviewed.     Assessment & Plan  Problem List Items Addressed This Visit    None    Visit Diagnoses    Annual physical exam    -  Primary   Relevant Orders   Renal Function Panel   Lipid Profile   POCT occult blood stool (Completed)   POCT urinalysis dipstick (Completed)   Taking medication for chronic disease       Relevant Orders   PSA   Need for Tdap vaccination       Relevant Orders   Tdap vaccine greater than or equal to 7yo IM (Completed)   Laceration of right upper extremity, initial encounter  Relevant Orders   Tdap vaccine greater than or equal to 7yo IM (Completed)      No orders of the defined types were placed in this encounter.     Dr. Hayden Rasmusseneanna Treyvonne Tata [facility]  08/06/16

## 2016-08-07 LAB — LIPID PANEL
CHOL/HDL RATIO: 4.9 ratio (ref 0.0–5.0)
Cholesterol, Total: 200 mg/dL — ABNORMAL HIGH (ref 100–199)
HDL: 41 mg/dL (ref >39)
LDL Calculated: 98 mg/dL (ref 0–99)
Triglycerides: 305 mg/dL — ABNORMAL HIGH (ref 0–149)
VLDL Cholesterol Cal: 61 mg/dL — ABNORMAL HIGH (ref 5–40)

## 2016-08-07 LAB — RENAL FUNCTION PANEL
Albumin: 4.4 g/dL (ref 3.5–5.5)
BUN/Creatinine Ratio: 10 (ref 9–20)
BUN: 10 mg/dL (ref 6–24)
CO2: 23 mmol/L (ref 18–29)
Calcium: 8.8 mg/dL (ref 8.7–10.2)
Chloride: 100 mmol/L (ref 96–106)
Creatinine, Ser: 1.05 mg/dL (ref 0.76–1.27)
GFR, EST AFRICAN AMERICAN: 89 mL/min/{1.73_m2} (ref >59)
GFR, EST NON AFRICAN AMERICAN: 77 mL/min/{1.73_m2} (ref >59)
GLUCOSE: 88 mg/dL (ref 65–99)
POTASSIUM: 4.1 mmol/L (ref 3.5–5.2)
Phosphorus: 2.3 mg/dL — ABNORMAL LOW (ref 2.5–4.5)
SODIUM: 137 mmol/L (ref 134–144)

## 2016-08-07 LAB — PSA: PROSTATE SPECIFIC AG, SERUM: 0.3 ng/mL (ref 0.0–4.0)

## 2016-08-09 ENCOUNTER — Other Ambulatory Visit: Payer: Self-pay | Admitting: Family Medicine

## 2016-08-09 DIAGNOSIS — N528 Other male erectile dysfunction: Secondary | ICD-10-CM

## 2016-08-10 ENCOUNTER — Other Ambulatory Visit: Payer: Self-pay | Admitting: Family Medicine

## 2016-08-10 DIAGNOSIS — I10 Essential (primary) hypertension: Secondary | ICD-10-CM

## 2016-08-11 ENCOUNTER — Other Ambulatory Visit: Payer: Self-pay

## 2016-08-11 DIAGNOSIS — N528 Other male erectile dysfunction: Secondary | ICD-10-CM

## 2016-08-11 MED ORDER — TADALAFIL 20 MG PO TABS: 20.0000 mg | ORAL_TABLET | Freq: Every day | ORAL | 11 refills | Status: DC | PRN

## 2016-08-21 ENCOUNTER — Other Ambulatory Visit: Payer: Self-pay | Admitting: Family Medicine

## 2016-08-21 DIAGNOSIS — I10 Essential (primary) hypertension: Secondary | ICD-10-CM

## 2016-08-26 ENCOUNTER — Other Ambulatory Visit: Payer: Self-pay | Admitting: Family Medicine

## 2016-08-26 DIAGNOSIS — K219 Gastro-esophageal reflux disease without esophagitis: Secondary | ICD-10-CM

## 2016-09-19 ENCOUNTER — Other Ambulatory Visit: Payer: Self-pay

## 2016-11-12 ENCOUNTER — Other Ambulatory Visit: Payer: Self-pay | Admitting: Family Medicine

## 2016-11-12 DIAGNOSIS — K219 Gastro-esophageal reflux disease without esophagitis: Secondary | ICD-10-CM

## 2016-12-24 ENCOUNTER — Other Ambulatory Visit: Payer: Self-pay | Admitting: Family Medicine

## 2016-12-24 DIAGNOSIS — I10 Essential (primary) hypertension: Secondary | ICD-10-CM

## 2017-04-09 ENCOUNTER — Other Ambulatory Visit: Payer: Self-pay

## 2017-04-10 ENCOUNTER — Ambulatory Visit: Payer: Commercial Managed Care - PPO | Admitting: Family Medicine

## 2017-04-10 ENCOUNTER — Encounter: Payer: Self-pay | Admitting: Family Medicine

## 2017-04-10 VITALS — BP 120/80 | HR 80 | Ht 70.0 in | Wt 183.0 lb

## 2017-04-10 DIAGNOSIS — M7522 Bicipital tendinitis, left shoulder: Secondary | ICD-10-CM

## 2017-04-10 DIAGNOSIS — M7592 Shoulder lesion, unspecified, left shoulder: Secondary | ICD-10-CM

## 2017-04-10 MED ORDER — MELOXICAM 15 MG PO TABS
15.0000 mg | ORAL_TABLET | Freq: Every day | ORAL | 0 refills | Status: DC
Start: 2017-04-10 — End: 2017-05-07

## 2017-04-10 NOTE — Patient Instructions (Signed)
Tendinitis Tendinitis is inflammation of a tendon. A tendon is a strong cord of tissue that connects muscle to bone. Tendinitis can affect any tendon, but it most commonly affects the shoulder tendon (rotator cuff), ankle tendon (Achilles tendon), elbow tendon (triceps tendon), or one of the tendons in the wrist. What are the causes? This condition may be caused by:  Overusing a tendon or muscle. This is common.  Age-related wear and tear.  Injury.  Inflammatory conditions, such as arthritis.  Certain medicines.  What increases the risk? This condition is more likely to develop in people who do activities that involve repetitive motions. What are the signs or symptoms? Symptoms of this condition may include:  Pain.  Tenderness.  Mild swelling.  How is this diagnosed? This condition is diagnosed with a physical exam. You may also have tests, such as:  Ultrasound. This uses sound waves to make an image of your affected area.  MRI.  How is this treated? This condition may be treated by resting, icing, applying pressure (compression), and raising (elevating) the area above the level of your heart. This is known as RICE therapy. Treatment may also include:  Medicines to help reduce inflammation or to help reduce pain.  Exercises or physical therapy to strengthen and stretch the tendon.  A brace or splint.  Surgery (rare).  Follow these instructions at home:  If you have a splint or brace:  Wear the splint or brace as told by your health care provider. Remove it only as told by your health care provider.  Loosen the splint or brace if your fingers or toes tingle, become numb, or turn cold and blue.  Do not take baths, swim, or use a hot tub until your health care provider approves. Ask your health care provider if you can take showers. You may only be allowed to take sponge baths for bathing.  Do not let your splint or brace get wet if it is not waterproof. ? If your  splint or brace is not waterproof, cover it with a watertight plastic bag when you take a bath or a shower.  Keep the splint or brace clean. Managing pain, stiffness, and swelling  If directed, apply ice to the affected area. ? Put ice in a plastic bag. ? Place a towel between your skin and the bag. ? Leave the ice on for 20 minutes, 2-3 times a day.  If directed, apply heat to the affected area as often as told by your health care provider. Use the heat source that your health care provider recommends, such as a moist heat pack or a heating pad. ? Place a towel between your skin and the heat source. ? Leave the heat on for 20-30 minutes. ? Remove the heat if your skin turns bright red. This is especially important if you are unable to feel pain, heat, or cold. You may have a greater risk of getting burned.  Move the fingers or toes of the affected limb often, if this applies. This can help to prevent stiffness and lessen swelling.  If directed, elevate the affected area above the level of your heart while you are sitting or lying down. Driving  Do not drive or operate heavy machinery while taking prescription pain medicine.  Ask your health care provider when it is safe to drive if you have a splint or brace on any part of your arm or leg. Activity  Return to your normal activities as told by your health care   provider. Ask your health care provider what activities are safe for you.  Rest the affected area as told by your health care provider.  Avoid using the affected area while you are experiencing symptoms of tendinitis.  Do exercises as told by your health care provider. General instructions  If you have a splint, do not put pressure on any part of the splint until it is fully hardened. This may take several hours.  Wear an elastic bandage or compression wrap only as told by your health care provider.  Take over-the-counter and prescription medicines only as told by your  health care provider.  Keep all follow-up visits as told by your health care provider. This is important. Contact a health care provider if:  Your symptoms do not improve.  You develop new, unexplained problems, such as numbness in your hands. This information is not intended to replace advice given to you by your health care provider. Make sure you discuss any questions you have with your health care provider. Document Released: 02/15/2000 Document Revised: 10/18/2015 Document Reviewed: 11/20/2014 Elsevier Interactive Patient Education  2018 [employer]  

## 2017-04-10 NOTE — Progress Notes (Signed)
Name: Bruce Joseph   MRN: [id number]    DOB: [birth year]   Date:04/10/2017       Progress Note  Subjective  Chief Complaint  Chief Complaint  Patient presents with  . Shoulder Pain    L) shoulder pain    Shoulder Pain   The pain is present in the left shoulder. This is a chronic problem. The current episode started more than 1 year ago. The problem occurs intermittently. The problem has been waxing and waning. The quality of the pain is described as aching. The pain is at a severity of 5/10. The pain is moderate. Associated symptoms include a limited range of motion and stiffness. Pertinent negatives include no fever, joint locking, joint swelling, numbness or tingling. The symptoms are aggravated by activity. He has tried NSAIDS for the symptoms. The treatment provided mild (eased off some) relief.    No problem-specific Assessment & Plan notes found for this encounter.   Past Medical History:  Diagnosis Date  . Anxiety   . Erectile dysfunction   . GERD (gastroesophageal reflux disease)   . Hypertension     Past Surgical History:  Procedure Laterality Date  . BACK SURGERY     lower back- disc repair  . COLONOSCOPY  2013   cleared for 5 yrs- Dr Mechele CollinElliott    Family History  Problem Relation Age of Onset  . Hypertension Mother   . Hypertension Brother   . Hypertension Maternal Grandfather     Social History   Socioeconomic History  . Marital status: Married    Spouse name: Not on file  . Number of children: Not on file  . Years of education: Not on file  . Highest education level: Not on file  Social Needs  . Financial resource strain: Not on file  . Food insecurity - worry: Not on file  . Food insecurity - inability: Not on file  . Transportation needs - medical: Not on file  . Transportation needs - non-medical: Not on file  Occupational History  . Not on file  Tobacco Use  . Smoking status: Current Every Day Smoker    Types: Cigarettes  . Smokeless tobacco:  Former NeurosurgeonUser  . Tobacco comment: gave info on smoking cessation options  Substance and Sexual Activity  . Alcohol use: Yes    Alcohol/week: 0.0 oz  . Drug use: No  . Sexual activity: Yes  Other Topics Concern  . Not on file  Social History Narrative  . Not on file    No Known Allergies  Outpatient Medications Prior to Visit  Medication Sig Dispense Refill  . finasteride (PROSCAR) 5 MG tablet TAKE 1 TABLET BY MOUTH EVERY DAY 30 tablet 5  . lisinopril-hydrochlorothiazide (PRINZIDE,ZESTORETIC) 10-12.5 MG tablet TAKE 1 TABLET BY MOUTH DAILY 90 tablet 0  . Multiple Vitamin (MULTI-VITAMINS) TABS Take 1 tablet by mouth daily.    Marland Kitchen. omeprazole (PRILOSEC) 20 MG capsule TAKE 1 CAPSULE (20 MG TOTAL) BY MOUTH DAILY. 90 capsule 0  . tadalafil (CIALIS) 20 MG tablet Take 1 tablet (20 mg total) by mouth daily as needed. 5 tablet 11   No facility-administered medications prior to visit.     Review of Systems  Constitutional: Negative for chills, fever, malaise/fatigue and weight loss.  HENT: Negative for ear discharge, ear pain and sore throat.   Eyes: Negative for blurred vision.  Respiratory: Negative for cough, sputum production, shortness of breath and wheezing.   Cardiovascular: Negative for chest pain, palpitations and leg  swelling.  Gastrointestinal: Negative for abdominal pain, blood in stool, constipation, diarrhea, heartburn, melena and nausea.  Genitourinary: Negative for dysuria, frequency, hematuria and urgency.  Musculoskeletal: Positive for stiffness. Negative for back pain, joint pain, myalgias and neck pain.  Skin: Negative for rash.  Neurological: Negative for dizziness, tingling, sensory change, focal weakness, numbness and headaches.  Endo/Heme/Allergies: Negative for environmental allergies and polydipsia. Does not bruise/bleed easily.  Psychiatric/Behavioral: Negative for depression and suicidal ideas. The patient is not nervous/anxious and does not have insomnia.       Objective  Vitals:   04/10/17 0847  BP: 120/80  Pulse: 80  Weight: 183 lb (83 kg)  Height: 5\' 10"  (1.778 m)    Physical Exam  Constitutional: He is oriented to person, place, and time and well-developed, well-nourished, and in no distress.  HENT:  Head: Normocephalic.  Right Ear: External ear normal.  Left Ear: External ear normal.  Nose: Nose normal.  Mouth/Throat: Oropharynx is clear and moist.  Eyes: Conjunctivae and EOM are normal. Pupils are equal, round, and reactive to light. Right eye exhibits no discharge. Left eye exhibits no discharge. No scleral icterus.  Neck: Normal range of motion. Neck supple. No JVD present. No tracheal deviation present. No thyromegaly present.  Cardiovascular: Normal rate, regular rhythm, normal heart sounds and intact distal pulses. Exam reveals no gallop and no friction rub.  No murmur heard. Pulmonary/Chest: Breath sounds normal. No respiratory distress. He has no wheezes. He has no rales.  Abdominal: Soft. Bowel sounds are normal. He exhibits no mass. There is no hepatosplenomegaly. There is no tenderness. There is no rebound, no guarding and no CVA tenderness.  Musculoskeletal: He exhibits no edema.       Left shoulder: He exhibits tenderness.  Tenderness/supraspinatis/bicps  Lymphadenopathy:    He has no cervical adenopathy.  Neurological: He is alert and oriented to person, place, and time. He has normal sensation, normal strength, normal reflexes and intact cranial nerves. No cranial nerve deficit.  Skin: Skin is warm. No rash noted.  Psychiatric: Mood and affect normal.  Nursing note and vitals reviewed.     Assessment & Plan  Problem List Items Addressed This Visit    None    Visit Diagnoses    Left supraspinatus tendinitis    -  Primary   Relevant Medications   meloxicam (MOBIC) 15 MG tablet   Other Relevant Orders   Ambulatory referral to Orthopedic Surgery   Biceps tendinitis of left upper extremity        Relevant Medications   meloxicam (MOBIC) 15 MG tablet   Other Relevant Orders   Ambulatory referral to Orthopedic Surgery      Meds ordered this encounter  Medications  . meloxicam (MOBIC) 15 MG tablet    Sig: Take 1 tablet (15 mg total) by mouth daily.    Dispense:  30 tablet    Refill:  0      Dr. [facility]  04/10/17

## 2017-05-07 ENCOUNTER — Other Ambulatory Visit: Payer: Self-pay | Admitting: Family Medicine

## 2017-05-07 DIAGNOSIS — M7522 Bicipital tendinitis, left shoulder: Secondary | ICD-10-CM

## 2017-05-07 DIAGNOSIS — M7592 Shoulder lesion, unspecified, left shoulder: Secondary | ICD-10-CM

## 2017-06-30 ENCOUNTER — Other Ambulatory Visit: Payer: Self-pay

## 2017-06-30 DIAGNOSIS — I10 Essential (primary) hypertension: Secondary | ICD-10-CM

## 2017-06-30 MED ORDER — LISINOPRIL-HYDROCHLOROTHIAZIDE 10-12.5 MG PO TABS: 1.0000 | ORAL_TABLET | Freq: Every day | ORAL | 0 refills | Status: DC

## 2017-08-13 ENCOUNTER — Other Ambulatory Visit: Payer: Self-pay | Admitting: Family Medicine

## 2017-08-13 DIAGNOSIS — K219 Gastro-esophageal reflux disease without esophagitis: Secondary | ICD-10-CM

## 2017-08-19 ENCOUNTER — Ambulatory Visit (INDEPENDENT_AMBULATORY_CARE_PROVIDER_SITE_OTHER): Payer: Commercial Managed Care - PPO | Admitting: Family Medicine

## 2017-08-19 ENCOUNTER — Encounter: Payer: Self-pay | Admitting: Family Medicine

## 2017-08-19 VITALS — BP 138/80 | HR 72 | Ht 70.0 in | Wt 189.0 lb

## 2017-08-19 DIAGNOSIS — N528 Other male erectile dysfunction: Secondary | ICD-10-CM

## 2017-08-19 DIAGNOSIS — Z0000 Encounter for general adult medical examination without abnormal findings: Secondary | ICD-10-CM

## 2017-08-19 DIAGNOSIS — K219 Gastro-esophageal reflux disease without esophagitis: Secondary | ICD-10-CM | POA: Diagnosis not present

## 2017-08-19 DIAGNOSIS — Z1211 Encounter for screening for malignant neoplasm of colon: Secondary | ICD-10-CM

## 2017-08-19 DIAGNOSIS — F17200 Nicotine dependence, unspecified, uncomplicated: Secondary | ICD-10-CM

## 2017-08-19 DIAGNOSIS — E785 Hyperlipidemia, unspecified: Secondary | ICD-10-CM | POA: Diagnosis not present

## 2017-08-19 DIAGNOSIS — L649 Androgenic alopecia, unspecified: Secondary | ICD-10-CM | POA: Diagnosis not present

## 2017-08-19 DIAGNOSIS — Z0001 Encounter for general adult medical examination with abnormal findings: Secondary | ICD-10-CM

## 2017-08-19 DIAGNOSIS — N400 Enlarged prostate without lower urinary tract symptoms: Secondary | ICD-10-CM

## 2017-08-19 DIAGNOSIS — I10 Essential (primary) hypertension: Secondary | ICD-10-CM | POA: Diagnosis not present

## 2017-08-19 LAB — HEMOCCULT GUIAC POC 1CARD (OFFICE): Fecal Occult Blood, POC: NEGATIVE

## 2017-08-19 MED ORDER — OMEPRAZOLE 20 MG PO CPDR: 20.0000 mg | DELAYED_RELEASE_CAPSULE | Freq: Every day | ORAL | 1 refills | Status: DC

## 2017-08-19 MED ORDER — LISINOPRIL-HYDROCHLOROTHIAZIDE 10-12.5 MG PO TABS: 1.0000 | ORAL_TABLET | Freq: Every day | ORAL | 1 refills | Status: DC

## 2017-08-19 MED ORDER — FINASTERIDE 5 MG PO TABS: 5.0000 mg | ORAL_TABLET | Freq: Every day | ORAL | 5 refills | Status: DC

## 2017-08-19 MED ORDER — TADALAFIL 20 MG PO TABS
20.0000 mg | ORAL_TABLET | Freq: Every day | ORAL | 11 refills | Status: DC | PRN
Start: 2017-08-19 — End: 2018-03-12

## 2017-08-19 NOTE — Progress Notes (Signed)
Name: Bruce Joseph   MRN: [id number]    DOB: [birth year]   Date:08/19/2017       Progress Note  Subjective  Chief Complaint  Chief Complaint  Patient presents with  . Annual Exam    no issues/ needs labs    Patient is a 62 year old male who presents for a comprehensive physical exam. The patient reports the following problems: . Health maintenance has been reviewed up to day. Hypertension  This is a chronic problem. The current episode started more than 1 year ago. The problem is unchanged. The problem is controlled. Pertinent negatives include no blurred vision, chest pain, headaches, malaise/fatigue, neck pain, palpitations, shortness of breath or sweats. Risk factors for coronary artery disease include diabetes mellitus, dyslipidemia and smoking/tobacco exposure. Past treatments include diuretics and ACE inhibitors. There are no compliance problems.  There is no history of angina, kidney disease, CAD/MI, CVA, heart failure, left ventricular hypertrophy, PVD or retinopathy.  Benign Prostatic Hypertrophy  This is a chronic problem. The current episode started more than 1 year ago. The problem is unchanged. Irritative symptoms include nocturia. Irritative symptoms do not include frequency or urgency. Pertinent negatives include no chills, dysuria, hematuria or nausea. Past treatments include finasteride (cialis). The treatment provided moderate relief.  Hyperlipidemia  This is a chronic problem. The problem is controlled. Recent lipid tests were reviewed and are normal. Pertinent negatives include no chest pain, focal weakness, myalgias or shortness of breath. Current antihyperlipidemic treatment includes diet change and exercise.  Gastroesophageal Reflux  He reports no abdominal pain, no belching, no chest pain, no choking, no coughing, no heartburn, no nausea, no sore throat, no stridor or no wheezing. The current episode started in the past 7 days. The problem occurs rarely. The problem has  been unchanged. The symptoms are aggravated by certain foods. Pertinent negatives include no melena or weight loss. He has tried a PPI for the symptoms.    Essential hypertension Controlled. 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 Will continue omeprazole 20 mg daily.   Past Medical History:  Diagnosis Date  . Anxiety   . Erectile dysfunction   . GERD (gastroesophageal reflux disease)   . Hypertension     Past Surgical History:  Procedure Laterality Date  . BACK SURGERY     lower back- disc repair  . COLONOSCOPY  2013   cleared for 5 yrs- Dr Mechele Collin    Family History  Problem Relation Age of Onset  . Hypertension Mother   . Hypertension Brother   . Hypertension Maternal Grandfather     Social History   Socioeconomic History  . Marital status: Married    Spouse name: Not on file  . Number of children: Not on file  . Years of education: Not on file  . Highest education level: Not on file  Occupational History  . Not on file  Social Needs  . Financial resource strain: Not on file  . Food insecurity:    Worry: Not on file    Inability: Not on file  . Transportation needs:    Medical: Not on file    Non-medical: Not on file  Tobacco Use  . Smoking status: Current Every Day Smoker    Types: Cigarettes  . Smokeless tobacco: Former Neurosurgeon  . Tobacco comment: gave info on smoking cessation options  Substance and Sexual Activity  . Alcohol use: Yes    Alcohol/week: 0.0 oz  . Drug use: No  . Sexual activity: Yes  Lifestyle  . Physical activity:    Days per week: Not on file    Minutes per session: Not on file  . Stress: Not on file  Relationships  . Social connections:    Talks on phone: Not on file    Gets together: Not on file    Attends religious service: Not on file    Active  member of club or organization: Not on file    Attends meetings of clubs or organizations: Not on file    Relationship status: Not on file  . Intimate partner violence:    Fear of current or ex partner: Not on file    Emotionally abused: Not on file    Physically abused: Not on file    Forced sexual activity: Not on file  Other Topics Concern  . Not on file  Social History Narrative  . Not on file    No Known Allergies  Outpatient Medications Prior to Visit  Medication Sig Dispense Refill  . Multiple Vitamin (MULTI-VITAMINS) TABS Take 1 tablet by mouth daily.    . finasteride (PROSCAR) 5 MG tablet TAKE 1 TABLET BY MOUTH EVERY DAY 30 tablet 5  . lisinopril-hydrochlorothiazide (PRINZIDE,ZESTORETIC) 10-12.5 MG tablet Take 1 tablet by mouth daily. 30 tablet 0  . omeprazole (PRILOSEC) 20 MG capsule TAKE 1 CAPSULE (20 MG TOTAL) BY MOUTH DAILY. 90 capsule 0  . tadalafil (CIALIS) 20 MG tablet Take 1 tablet (20 mg total) by mouth daily as needed. 5 tablet 11  . meloxicam (MOBIC) 15 MG tablet TAKE 1 TABLET BY MOUTH EVERY DAY 30 tablet 0   No facility-administered medications prior to visit.     Review of Systems  Constitutional: Negative for chills, fever, malaise/fatigue and weight loss.  HENT: Negative for ear discharge, ear pain and sore throat.   Eyes: Negative for blurred vision.  Respiratory: Negative for cough, sputum production, choking, shortness of breath and wheezing.   Cardiovascular: Negative for chest pain, palpitations and leg swelling.  Gastrointestinal: Negative for abdominal pain, blood in stool, constipation, diarrhea, heartburn, melena and nausea.  Genitourinary: Positive for nocturia. Negative for dysuria, frequency, hematuria and urgency.  Musculoskeletal: Negative for back pain, joint pain, myalgias and neck pain.  Skin: Negative for rash.  Neurological: Negative for dizziness, tingling, sensory change, focal weakness and headaches.  Endo/Heme/Allergies: Negative  for environmental allergies and polydipsia. Does not bruise/bleed easily.  Psychiatric/Behavioral: Negative for depression and suicidal ideas. The patient is not nervous/anxious and does not have insomnia.      Objective  Vitals:   08/19/17 0830  BP: 138/80  Pulse: 72  Weight: 189 lb (85.7 kg)  Height: 5\' 10"  (1.778 m)    Physical Exam  Constitutional: He is oriented to person, place, and time. Vital signs are normal. He appears well-developed and well-nourished.  HENT:  Head: Normocephalic and atraumatic.  Right Ear: Hearing, tympanic membrane, external ear and ear canal normal.  Left Ear: Hearing, tympanic membrane, external ear and ear canal normal.  Nose: Nose normal.  Mouth/Throat: Uvula is midline and oropharynx is clear and moist. Normal dentition. No oropharyngeal exudate, posterior oropharyngeal edema or posterior oropharyngeal erythema.  Eyes: Pupils are equal, round, and reactive to light. Conjunctivae, EOM and lids are normal. Right eye exhibits no discharge. Left eye exhibits no discharge. No scleral icterus.  Fundoscopic exam:  The right eye shows no arteriolar narrowing and no AV nicking.       The left eye shows no arteriolar narrowing and no AV nicking.  Neck: Trachea normal, normal range of motion and full passive range of motion without pain. Neck supple. Normal carotid pulses, no hepatojugular reflux and no JVD present. Carotid bruit is not present. No tracheal deviation present. No thyroid mass and no thyromegaly present.  Cardiovascular: Normal rate, regular rhythm, S1 normal, S2 normal, normal heart sounds and intact distal pulses. PMI is not displaced. Exam reveals no gallop, no S3, no S4, no distant heart sounds and no friction rub.  No murmur heard. Pulses:      Carotid pulses are 2+ on the right side, and 2+ on the left side.      Radial pulses are 2+ on the right side, and 2+ on the left side.       Femoral pulses are 2+ on the right side, and 2+ on  the left side.      Popliteal pulses are 2+ on the right side, and 2+ on the left side.       Dorsalis pedis pulses are 2+ on the right side, and 2+ on the left side.       Posterior tibial pulses are 2+ on the right side, and 2+ on the left side.  Pulmonary/Chest: Breath sounds normal. No respiratory distress. He has no decreased breath sounds. He has no wheezes. He has no rales.  Abdominal: Soft. Normal appearance. He exhibits no mass. Bowel sounds are decreased. There is no hepatosplenomegaly. There is no tenderness. There is no rebound, no guarding and no CVA tenderness. No hernia. Hernia confirmed negative in the right inguinal area and confirmed negative in the left inguinal area.  Genitourinary: Rectum normal, prostate normal, testes normal and penis normal. Circumcised.  Musculoskeletal: Normal range of motion. He exhibits no edema or tenderness.       Lumbar back: He exhibits spasm.       Back:  Scar noted  Lymphadenopathy:       Head (right side): No submandibular adenopathy present.       Head (left side): No submandibular adenopathy present.    He has no cervical adenopathy.    He has no axillary adenopathy. No inguinal adenopathy noted on the right or left side.  Neurological: He is alert and oriented to person, place, and time. He has normal strength and normal reflexes. No cranial nerve deficit or sensory deficit.  Reflex Scores:      Tricep reflexes are 2+ on the right side and 2+ on the left side.      Bicep reflexes are 2+ on the right side and 2+ on the left side.      Brachioradialis reflexes are 2+ on the right side and 2+ on the left side.      Patellar reflexes are 2+ on the right side and 2+ on the left side.      Achilles reflexes are 2+ on the right side and 2+ on the left side. Skin: Skin is warm, dry and intact. No rash noted.  tan  Nursing note and vitals reviewed.     Assessment & Plan  Problem List Items Addressed This Visit      Cardiovascular and  Mediastinum   Essential hypertension    Controlled. Will continue lisinopril 10 mg and HCTZ 12.5 daily and check renal panel      Relevant Medications   lisinopril-hydrochlorothiazide (  PRINZIDE,ZESTORETIC) 10-12.5 MG tablet   tadalafil (CIALIS) 20 MG tablet   Other Relevant Orders   Renal Function Panel     Digestive   Esophageal reflux    Controlled. Will continue omeprazole 20 mg daily.      Relevant Medications   omeprazole (PRILOSEC) 20 MG capsule     Endocrine   Male pattern baldness    Continue finasteride/maintaining        Genitourinary   Erectile dysfunction    Enabled with cialis 20 mg prn      Relevant Medications   tadalafil (CIALIS) 20 MG tablet    Other Visit Diagnoses    Annual physical exam    -  Primary   No subjective nor objective concerns. Health maintenance issues discussed. Labs obtained.   Relevant Orders   POCT occult blood stool (Completed)   Tobacco dependence       Benign prostatic hyperplasia without lower urinary tract symptoms       Controlled . will continue proscar and daily cialis 5 mg. Will check PSA.   Relevant Medications   finasteride (PROSCAR) 5 MG tablet   Other Relevant Orders   PSA   Dyslipidemia       Controlled wihth diet/Will check lipid panel   Relevant Orders   Lipid panel   Colon cancer screening       Relevant Orders   POCT occult blood stool (Completed)      Meds ordered this encounter  Medications  . lisinopril-hydrochlorothiazide (PRINZIDE,ZESTORETIC) 10-12.5 MG tablet    Sig: Take 1 tablet by mouth daily.    Dispense:  90 tablet    Refill:  1    sched appt for Dec  . omeprazole (PRILOSEC) 20 MG capsule    Sig: Take 1 capsule (20 mg total) by mouth daily.    Dispense:  90 capsule    Refill:  1  . tadalafil (CIALIS) 20 MG tablet    Sig: Take 1 tablet (20 mg total) by mouth daily as needed.    Dispense:  5 tablet    Refill:  11  . finasteride (PROSCAR) 5 MG tablet    Sig: Take 1 tablet (5 mg total)  by mouth daily.    Dispense:  30 tablet    Refill:  5  Bruce Joseph is a 62 y.o. male who presents today for his Complete Annual Exam. He feels well. He reports exercising daily. He reports he is sleeping fairly well..  Immunizations are reviewed and recommendations provided.   Age appropriate screening tests are discussed. Counseling given for risk factor reduction interventions. Patient has been advised of the health risks of smoking and counseled concerning cessation of tobacco products. I spent over 3 minutes for discussion and to answer questions.  Dr. Hayden [facility]  08/19/17

## 2017-08-19 NOTE — Assessment & Plan Note (Signed)
Controlled. Will continue lisinopril 10 mg and HCTZ 12.5 daily and check renal panel

## 2017-08-19 NOTE — Assessment & Plan Note (Signed)
Continue finasteride/maintaining

## 2017-08-19 NOTE — Assessment & Plan Note (Signed)
Controlled. Will continue omeprazole 20 mg daily.

## 2017-08-19 NOTE — Assessment & Plan Note (Signed)
Enabled with cialis 20 mg prn

## 2017-08-20 LAB — RENAL FUNCTION PANEL
Albumin: 4.4 g/dL (ref 3.6–4.8)
BUN / CREAT RATIO: 12 (ref 10–24)
BUN: 12 mg/dL (ref 8–27)
CALCIUM: 8.8 mg/dL (ref 8.6–10.2)
CO2: 20 mmol/L (ref 20–29)
CREATININE: 0.98 mg/dL (ref 0.76–1.27)
Chloride: 104 mmol/L (ref 96–106)
GFR, EST AFRICAN AMERICAN: 96 mL/min/{1.73_m2} (ref >59)
GFR, EST NON AFRICAN AMERICAN: 83 mL/min/{1.73_m2} (ref >59)
Glucose: 78 mg/dL (ref 65–99)
Phosphorus: 2.5 mg/dL (ref 2.5–4.5)
Potassium: 3.7 mmol/L (ref 3.5–5.2)
SODIUM: 143 mmol/L (ref 134–144)

## 2017-08-20 LAB — LIPID PANEL
CHOLESTEROL TOTAL: 201 mg/dL — AB (ref 100–199)
Chol/HDL Ratio: 4.8 ratio (ref 0.0–5.0)
HDL: 42 mg/dL (ref >39)
LDL Calculated: 118 mg/dL — ABNORMAL HIGH (ref 0–99)
TRIGLYCERIDES: 205 mg/dL — AB (ref 0–149)
VLDL Cholesterol Cal: 41 mg/dL — ABNORMAL HIGH (ref 5–40)

## 2017-08-20 LAB — PSA: PROSTATE SPECIFIC AG, SERUM: 0.6 ng/mL (ref 0.0–4.0)

## 2018-02-10 ENCOUNTER — Other Ambulatory Visit: Payer: Self-pay | Admitting: Family Medicine

## 2018-02-10 DIAGNOSIS — N400 Enlarged prostate without lower urinary tract symptoms: Secondary | ICD-10-CM

## 2018-03-12 ENCOUNTER — Ambulatory Visit: Payer: Commercial Managed Care - PPO | Admitting: Family Medicine

## 2018-03-12 ENCOUNTER — Encounter: Payer: Self-pay | Admitting: Family Medicine

## 2018-03-12 VITALS — BP 120/62 | HR 80 | Ht 70.0 in | Wt 195.0 lb

## 2018-03-12 DIAGNOSIS — Z23 Encounter for immunization: Secondary | ICD-10-CM | POA: Diagnosis not present

## 2018-03-12 DIAGNOSIS — N528 Other male erectile dysfunction: Secondary | ICD-10-CM

## 2018-03-12 DIAGNOSIS — K219 Gastro-esophageal reflux disease without esophagitis: Secondary | ICD-10-CM

## 2018-03-12 DIAGNOSIS — N400 Enlarged prostate without lower urinary tract symptoms: Secondary | ICD-10-CM

## 2018-03-12 DIAGNOSIS — I10 Essential (primary) hypertension: Secondary | ICD-10-CM | POA: Diagnosis not present

## 2018-03-12 MED ORDER — TADALAFIL 20 MG PO TABS: 20.0000 mg | ORAL_TABLET | Freq: Every day | ORAL | 11 refills | Status: DC | PRN

## 2018-03-12 MED ORDER — LISINOPRIL-HYDROCHLOROTHIAZIDE 10-12.5 MG PO TABS: 1.0000 | ORAL_TABLET | Freq: Every day | ORAL | 1 refills | Status: DC

## 2018-03-12 MED ORDER — FINASTERIDE 5 MG PO TABS: 5.0000 mg | ORAL_TABLET | Freq: Every day | ORAL | 1 refills | Status: DC

## 2018-03-12 MED ORDER — OMEPRAZOLE 20 MG PO CPDR: 20.0000 mg | DELAYED_RELEASE_CAPSULE | Freq: Every day | ORAL | 1 refills | Status: DC

## 2018-03-12 NOTE — Progress Notes (Signed)
Date:  03/12/2018   Name:  Bruce Joseph   DOB:  [birth year]   MRN:  [id number]   Chief Complaint: Hypertension; Erectile Dysfunction; Benign Prostatic Hypertrophy; Gastroesophageal Reflux; and Flu Vaccine  Hypertension  This is a chronic problem. The current episode started more than 1 year ago. The problem is unchanged. The problem is controlled. Pertinent negatives include no anxiety, blurred vision, chest pain, headaches, malaise/fatigue, neck pain, orthopnea, palpitations, peripheral edema, PND, shortness of breath or sweats. There are no associated agents to hypertension. Past treatments include ACE inhibitors and diuretics. The current treatment provides moderate improvement. There are no compliance problems.  There is no history of angina, kidney disease, CAD/MI, CVA, heart failure, left ventricular hypertrophy, PVD or retinopathy. There is no history of chronic renal disease, a hypertension causing med or renovascular disease.  Erectile Dysfunction  This is a chronic problem. The current episode started more than 1 year ago. The problem is unchanged. The nature of his difficulty is achieving erection and maintaining erection. He reports no anxiety. Irritative symptoms do not include frequency, nocturia or urgency. Obstructive symptoms do not include dribbling, incomplete emptying, an intermittent stream, a slower stream, straining or a weak stream. Pertinent negatives include no chills, dysuria or hematuria. Past treatments include tadalafil. The treatment provided moderate relief.  Benign Prostatic Hypertrophy  This is a chronic problem. The problem is unchanged. Irritative symptoms do not include frequency, nocturia or urgency. Obstructive symptoms do not include dribbling, incomplete emptying, an intermittent stream, a slower stream, straining or a weak stream. Pertinent negatives include no chills, dysuria, hematuria or nausea. Past treatments include finasteride.  Gastroesophageal Reflux   He reports no abdominal pain, no belching, no chest pain, no choking, no coughing, no dysphagia, no heartburn, no hoarse voice, no nausea, no sore throat or no wheezing. This is a recurrent problem. The problem has been gradually worsening. The symptoms are aggravated by certain foods. He has tried a PPI for the symptoms.    Review of Systems  Constitutional: Negative for chills, fever and malaise/fatigue.  HENT: Negative for drooling, ear discharge, ear pain, hoarse voice and sore throat.   Eyes: Negative for blurred vision.  Respiratory: Negative for cough, choking, shortness of breath and wheezing.   Cardiovascular: Negative for chest pain, palpitations, orthopnea, leg swelling and PND.  Gastrointestinal: Negative for abdominal pain, blood in stool, constipation, diarrhea, dysphagia, heartburn and nausea.  Endocrine: Negative for polydipsia.  Genitourinary: Negative for dysuria, frequency, hematuria, incomplete emptying, nocturia and urgency.  Musculoskeletal: Negative for back pain, myalgias and neck pain.  Skin: Negative for rash.  Allergic/Immunologic: Negative for environmental allergies.  Neurological: Negative for dizziness and headaches.  Hematological: Does not bruise/bleed easily.  Psychiatric/Behavioral: Negative for suicidal ideas. The patient is not nervous/anxious.     Patient Active Problem List   Diagnosis Date Noted  . Back pain, chronic 12/14/2014  . H/O adenomatous polyp of colon 12/14/2014  . Failure of erection 12/14/2014  . Current tobacco use 12/14/2014  . Male pattern baldness 12/14/2014  . Essential hypertension 12/14/2014  . Esophageal reflux 12/14/2014  . Erectile dysfunction 12/14/2014    No Known Allergies  Past Surgical History:  Procedure Laterality Date  . BACK SURGERY     lower back- disc repair  . COLONOSCOPY  2013   cleared for 5 yrs- Dr Mechele CollinElliott    Social History   Tobacco Use  . Smoking status: Former Smoker    Types: Cigarettes  Last attempt to quit: 02/19/2018    Years since quitting: 0.0  . Smokeless tobacco: Former Neurosurgeon  . Tobacco comment: gave info on smoking cessation options  Substance Use Topics  . Alcohol use: Yes    Alcohol/week: 0.0 standard drinks  . Drug use: No     Medication list has been reviewed and updated.  Current Meds  Medication Sig  . finasteride (PROSCAR) 5 MG tablet TAKE 1 TABLET BY MOUTH EVERY DAY  . lisinopril-hydrochlorothiazide (PRINZIDE,ZESTORETIC) 10-12.5 MG tablet Take 1 tablet by mouth daily.  . Multiple Vitamin (MULTI-VITAMINS) TABS Take 1 tablet by mouth daily.  Marland Kitchen omeprazole (PRILOSEC) 20 MG capsule Take 1 capsule (20 mg total) by mouth daily.  . tadalafil (CIALIS) 20 MG tablet Take 1 tablet (20 mg total) by mouth daily as needed.    PHQ 2/9 Scores 04/10/2017 06/14/2015 03/30/2015  PHQ - 2 Score 0 0 0  PHQ- 9 Score 1 - -    Physical Exam Vitals signs and nursing note reviewed.  HENT:     Head: Normocephalic.     Right Ear: External ear normal.     Left Ear: External ear normal.     Nose: Nose normal.     Mouth/Throat:     Mouth: Mucous membranes are moist.  Eyes:     General: No scleral icterus.       Right eye: No discharge.        Left eye: No discharge.     Conjunctiva/sclera: Conjunctivae normal.     Pupils: Pupils are equal, round, and reactive to light.  Neck:     Musculoskeletal: Normal range of motion and neck supple.     Thyroid: No thyromegaly.     Vascular: No JVD.     Trachea: No tracheal deviation.  Cardiovascular:     Rate and Rhythm: Normal rate and regular rhythm.     Heart sounds: Normal heart sounds. No murmur. No friction rub. No gallop.   Pulmonary:     Effort: No respiratory distress.     Breath sounds: Normal breath sounds. No wheezing or rales.  Abdominal:     General: Bowel sounds are normal. There is no distension.     Palpations: Abdomen is soft. There is no mass.     Tenderness: There is no abdominal tenderness. There is no  right CVA tenderness, left CVA tenderness, guarding or rebound.     Hernia: No hernia is present.  Musculoskeletal: Normal range of motion.        General: No tenderness.  Lymphadenopathy:     Cervical: No cervical adenopathy.  Skin:    General: Skin is warm.     Findings: No rash.  Neurological:     Mental Status: He is alert and oriented to person, place, and time.     Cranial Nerves: No cranial nerve deficit.     Deep Tendon Reflexes: Reflexes are normal and symmetric.     BP 120/62   Pulse 80   Ht 5\' 10"  (1.778 m)   Wt 195 lb (88.5 kg)   BMI 27.98 kg/m   Assessment and Plan: 1. Benign prostatic hyperplasia without lower urinary tract symptoms Chronic. Controlled on med- refill finasteride - finasteride (PROSCAR) 5 MG tablet; Take 1 tablet (5 mg total) by mouth daily.  Dispense: 90 tablet; Refill: 1  2. Essential hypertension Chronic. Controlled on med- refill lisinopril/HCTZ - lisinopril-hydrochlorothiazide (PRINZIDE,ZESTORETIC) 10-12.5 MG tablet; Take 1 tablet by mouth daily.  Dispense: 90 tablet; Refill:  1  3. Gastroesophageal reflux disease, esophagitis presence not specified Chronic. Controlled- refill omeprazole - omeprazole (PRILOSEC) 20 MG capsule; Take 1 capsule (20 mg total) by mouth daily.  Dispense: 90 capsule; Refill: 1  4. Other male erectile dysfunction Advised to take half a tablet- refill med - tadalafil (CIALIS) 20 MG tablet; Take 1 tablet (20 mg total) by mouth daily as needed.  Dispense: 5 tablet; Refill: 11  5. Flu vaccine need Discussed and administered - Flu Vaccine QUAD 6+ mos PF IM (Fluarix Quad PF)

## 2018-09-05 ENCOUNTER — Other Ambulatory Visit: Payer: Self-pay | Admitting: Family Medicine

## 2018-09-05 DIAGNOSIS — N400 Enlarged prostate without lower urinary tract symptoms: Secondary | ICD-10-CM

## 2018-09-13 ENCOUNTER — Other Ambulatory Visit: Payer: Self-pay | Admitting: Family Medicine

## 2018-09-13 DIAGNOSIS — I10 Essential (primary) hypertension: Secondary | ICD-10-CM

## 2018-09-16 ENCOUNTER — Encounter: Payer: Self-pay | Admitting: Family Medicine

## 2018-09-16 ENCOUNTER — Other Ambulatory Visit: Payer: Self-pay

## 2018-09-16 ENCOUNTER — Ambulatory Visit: Payer: Commercial Managed Care - PPO | Admitting: Family Medicine

## 2018-09-16 VITALS — BP 134/78 | HR 83 | Ht 70.0 in | Wt 188.0 lb

## 2018-09-16 DIAGNOSIS — K219 Gastro-esophageal reflux disease without esophagitis: Secondary | ICD-10-CM

## 2018-09-16 DIAGNOSIS — I10 Essential (primary) hypertension: Secondary | ICD-10-CM

## 2018-09-16 DIAGNOSIS — E782 Mixed hyperlipidemia: Secondary | ICD-10-CM

## 2018-09-16 DIAGNOSIS — N400 Enlarged prostate without lower urinary tract symptoms: Secondary | ICD-10-CM

## 2018-09-16 DIAGNOSIS — N528 Other male erectile dysfunction: Secondary | ICD-10-CM | POA: Diagnosis not present

## 2018-09-16 MED ORDER — FINASTERIDE 5 MG PO TABS: 5.0000 mg | ORAL_TABLET | Freq: Every day | ORAL | 1 refills | Status: AC

## 2018-09-16 MED ORDER — TADALAFIL 20 MG PO TABS: 20.0000 mg | ORAL_TABLET | Freq: Every day | ORAL | 11 refills | Status: AC | PRN

## 2018-09-16 MED ORDER — LISINOPRIL-HYDROCHLOROTHIAZIDE 10-12.5 MG PO TABS: 1.0000 | ORAL_TABLET | Freq: Every day | ORAL | 1 refills | Status: AC

## 2018-09-16 MED ORDER — OMEPRAZOLE 20 MG PO CPDR: 20.0000 mg | DELAYED_RELEASE_CAPSULE | Freq: Every day | ORAL | 1 refills | Status: AC

## 2018-09-16 NOTE — Progress Notes (Signed)
Date:  09/16/2018   Name:  Bruce Joseph   DOB:  1955/08/30   MRN:  [id number]   Chief Complaint: Hypertension, Gastroesophageal Reflux, Erectile Dysfunction, and Alopecia  Hypertension This is a chronic problem. The current episode started more than 1 year ago. The problem is unchanged. The problem is controlled. Pertinent negatives include no anxiety, blurred vision, chest pain, headaches, malaise/fatigue, neck pain, orthopnea, palpitations, peripheral edema, PND, shortness of breath or sweats. There is no history of angina, kidney disease, CAD/MI, CVA, heart failure, left ventricular hypertrophy, PVD or retinopathy. There is no history of chronic renal disease.  Gastroesophageal Reflux He reports no abdominal pain, no belching, no chest pain, no choking, no coughing, no early satiety, no globus sensation, no heartburn, no hoarse voice, no nausea, no sore throat, no stridor, no tooth decay, no water brash or no wheezing. This is a chronic problem. The problem has been gradually improving. The symptoms are aggravated by certain foods. Pertinent negatives include no anemia, melena, muscle weakness or weight loss. He has tried a PPI for the symptoms. The treatment provided moderate relief.  Erectile Dysfunction This is a chronic problem. The current episode started more than 1 year ago. The problem is unchanged. The nature of his difficulty is achieving erection. He reports no anxiety. Irritative symptoms include nocturia. Irritative symptoms do not include frequency or urgency. Obstructive symptoms do not include dribbling, incomplete emptying, an intermittent stream, a slower stream, straining or a weak stream. Pertinent negatives include no chills, dysuria or hematuria. Past treatments include tadalafil. The treatment provided moderate relief.  Benign Prostatic Hypertrophy This is a chronic problem. The problem has been gradually improving since onset. Irritative symptoms include nocturia.  Irritative symptoms do not include frequency or urgency. Obstructive symptoms do not include dribbling, incomplete emptying, an intermittent stream, a slower stream, straining or a weak stream. Pertinent negatives include no chills, dysuria, hematuria or nausea.  Hyperlipidemia This is a chronic problem. The problem is uncontrolled. Recent lipid tests were reviewed and are variable. He has no history of chronic renal disease, diabetes, hypothyroidism, liver disease, obesity or nephrotic syndrome. Factors aggravating his hyperlipidemia include thiazides. Pertinent negatives include no chest pain, myalgias or shortness of breath. Current antihyperlipidemic treatment includes diet change.    Review of Systems  Constitutional: Negative for chills, fever, malaise/fatigue and weight loss.  HENT: Negative for drooling, ear discharge, ear pain, hoarse voice and sore throat.   Eyes: Negative for blurred vision.  Respiratory: Negative for cough, choking, shortness of breath and wheezing.   Cardiovascular: Negative for chest pain, palpitations, orthopnea, leg swelling and PND.  Gastrointestinal: Negative for abdominal pain, blood in stool, constipation, diarrhea, heartburn, melena and nausea.  Endocrine: Negative for polydipsia.  Genitourinary: Positive for nocturia. Negative for dysuria, frequency, hematuria, incomplete emptying and urgency.  Musculoskeletal: Negative for back pain, myalgias, muscle weakness and neck pain.  Skin: Negative for rash.  Allergic/Immunologic: Negative for environmental allergies.  Neurological: Negative for dizziness and headaches.  Hematological: Does not bruise/bleed easily.  Psychiatric/Behavioral: Negative for suicidal ideas. The patient is not nervous/anxious.     Patient Active Problem List   Diagnosis Date Noted   Back pain, chronic 12/14/2014   H/O adenomatous polyp of colon 12/14/2014   Failure of erection 12/14/2014   Current tobacco use 12/14/2014   Male  pattern baldness 12/14/2014   Essential hypertension 12/14/2014   Esophageal reflux 12/14/2014   Erectile dysfunction 12/14/2014    No Known Allergies  Past  Surgical History:  Procedure Laterality Date   BACK SURGERY     lower back- disc repair   COLONOSCOPY  2013   cleared for 5 yrs- Dr Vira Agar    Social History   Tobacco Use   Smoking status: Former Smoker    Types: Cigarettes    Quit date: 02/19/2018    Years since quitting: 0.5   Smokeless tobacco: Former Systems developer   Tobacco comment: gave info on smoking cessation options  Substance Use Topics   Alcohol use: Yes    Alcohol/week: 0.0 standard drinks   Drug use: No     Medication list has been reviewed and updated.  Current Meds  Medication Sig   finasteride (PROSCAR) 5 MG tablet TAKE 1 TABLET BY MOUTH EVERY DAY   lisinopril-hydrochlorothiazide (ZESTORETIC) 10-12.5 MG tablet TAKE 1 TABLET BY MOUTH EVERY DAY   Multiple Vitamin (MULTI-VITAMINS) TABS Take 1 tablet by mouth daily.   omeprazole (PRILOSEC) 20 MG capsule Take 1 capsule (20 mg total) by mouth daily.   tadalafil (CIALIS) 20 MG tablet Take 1 tablet (20 mg total) by mouth daily as needed.    PHQ 2/9 Scores 09/16/2018 04/10/2017 06/14/2015 03/30/2015  PHQ - 2 Score 0 0 0 0  PHQ- 9 Score 0 1 - -    BP Readings from Last 3 Encounters:  09/16/18 134/78  03/12/18 120/62  08/19/17 138/80    Physical Exam Vitals signs and nursing note reviewed.  Constitutional:      Comments: overweight  HENT:     Head: Normocephalic.     Right Ear: Tympanic membrane, ear canal and external ear normal.     Left Ear: Tympanic membrane, ear canal and external ear normal.     Nose: Nose normal.     Mouth/Throat:     Mouth: Mucous membranes are moist.     Pharynx: Oropharynx is clear. No oropharyngeal exudate.  Eyes:     General: No scleral icterus.       Right eye: No discharge.        Left eye: No discharge.     Extraocular Movements: Extraocular movements  intact.     Conjunctiva/sclera: Conjunctivae normal.     Pupils: Pupils are equal, round, and reactive to light.  Neck:     Musculoskeletal: Normal range of motion and neck supple.     Thyroid: No thyroid mass, thyromegaly or thyroid tenderness.     Vascular: No JVD.     Trachea: No tracheal deviation.  Cardiovascular:     Rate and Rhythm: Normal rate and regular rhythm.     Pulses: Normal pulses. No decreased pulses.          Carotid pulses are 2+ on the right side and 2+ on the left side.      Radial pulses are 2+ on the right side and 2+ on the left side.       Femoral pulses are 2+ on the right side and 2+ on the left side.      Popliteal pulses are 2+ on the right side and 2+ on the left side.       Dorsalis pedis pulses are 2+ on the right side and 2+ on the left side.       Posterior tibial pulses are 2+ on the right side and 2+ on the left side.     Heart sounds: Normal heart sounds, S1 normal and S2 normal. No murmur. No systolic murmur. No diastolic murmur. No friction rub. No  gallop. No S3 or S4 sounds.   Pulmonary:     Effort: No respiratory distress.     Breath sounds: Normal breath sounds. No wheezing or rales.  Abdominal:     General: Abdomen is flat. Bowel sounds are normal. There is no distension.     Palpations: Abdomen is soft. There is no hepatomegaly, splenomegaly or mass.     Tenderness: There is no abdominal tenderness. There is no guarding or rebound.  Genitourinary:    Prostate: Normal. Not enlarged, not tender and no nodules present.     Rectum: Normal. Guaiac result negative. No mass.  Musculoskeletal: Normal range of motion.        General: No tenderness.     Right lower leg: No edema.     Left lower leg: No edema.  Lymphadenopathy:     Cervical: No cervical adenopathy.     Right cervical: No superficial, deep or posterior cervical adenopathy.    Left cervical: No superficial, deep or posterior cervical adenopathy.  Skin:    General: Skin is warm.      Findings: No rash.  Neurological:     Mental Status: He is alert and oriented to person, place, and time.     Cranial Nerves: No cranial nerve deficit.     Deep Tendon Reflexes: Reflexes are normal and symmetric.     Wt Readings from Last 3 Encounters:  09/16/18 188 lb (85.3 kg)  03/12/18 195 lb (88.5 kg)  08/19/17 189 lb (85.7 kg)    BP 134/78    Pulse 83    Ht 5\' 10"  (1.778 m)    Wt 188 lb (85.3 kg)    SpO2 97%    BMI 26.98 kg/m    Assessment and Plan:  1. Benign prostatic hyperplasia without lower urinary tract symptoms Chronic.  Controlled.  Continue finasteride 5 mg once a day.  BPH symptoms have improved on medication. - finasteride (PROSCAR) 5 MG tablet; Take 1 tablet (5 mg total) by mouth daily.  Dispense: 90 tablet; Refill: 1 - PSA  2. Essential hypertension Chronic.  Controlled.  Continue lisinopril hydrochlorothiazide 10-12 0.5 once a day.  Will check renal function panel. - lisinopril-hydrochlorothiazide (ZESTORETIC) 10-12.5 MG tablet; Take 1 tablet by mouth daily.  Dispense: 90 tablet; Refill: 1 - Renal Function Panel  3. Gastroesophageal reflux disease, esophagitis presence not specified Chronic.  Controlled.  Continue Prilosec 20 mg once a day. - omeprazole (PRILOSEC) 20 MG capsule; Take 1 capsule (20 mg total) by mouth daily.  Dispense: 90 capsule; Refill: 1  4. Other male erectile dysfunction Chronic.  Controlled.  Patient is tolerating and prefers to stay on Cialis 20 mg 1 tablet as needed. - tadalafil (CIALIS) 20 MG tablet; Take 1 tablet (20 mg total) by mouth daily as needed.  Dispense: 5 tablet; Refill: 11  5. Moderate mixed hyperlipidemia not requiring statin therapy Patient has a history of elevated cholesterol for which he is been using dietary control for.  Will continue with lipid panel for evaluation and determine if statin will be necessary. - Lipid Panel With LDL/HDL Ratio

## 2018-09-16 NOTE — Patient Instructions (Signed)

## 2018-09-17 LAB — RENAL FUNCTION PANEL
Albumin: 4.2 g/dL (ref 3.8–4.8)
BUN/Creatinine Ratio: 12 (ref 10–24)
BUN: 14 mg/dL (ref 8–27)
CO2: 24 mmol/L (ref 20–29)
Calcium: 9.1 mg/dL (ref 8.6–10.2)
Chloride: 102 mmol/L (ref 96–106)
Creatinine, Ser: 1.17 mg/dL (ref 0.76–1.27)
GFR calc Af Amer: 77 mL/min/{1.73_m2} (ref >59)
GFR calc non Af Amer: 67 mL/min/{1.73_m2} (ref >59)
Glucose: 81 mg/dL (ref 65–99)
Phosphorus: 3.6 mg/dL (ref 2.8–4.1)
Potassium: 5.1 mmol/L (ref 3.5–5.2)
Sodium: 137 mmol/L (ref 134–144)

## 2018-09-17 LAB — PSA: Prostate Specific Ag, Serum: 0.3 ng/mL (ref 0.0–4.0)

## 2018-09-17 LAB — LIPID PANEL WITH LDL/HDL RATIO
Cholesterol, Total: 189 mg/dL (ref 100–199)
HDL: 43 mg/dL (ref >39)
LDL Calculated: 111 mg/dL — ABNORMAL HIGH (ref 0–99)
LDl/HDL Ratio: 2.6 ratio (ref 0.0–3.6)
Triglycerides: 173 mg/dL — ABNORMAL HIGH (ref 0–149)
VLDL Cholesterol Cal: 35 mg/dL (ref 5–40)

## 2019-03-23 ENCOUNTER — Ambulatory Visit: Payer: Commercial Managed Care - PPO | Admitting: Family Medicine

## 2021-08-16 ENCOUNTER — Encounter: Payer: Self-pay | Admitting: Emergency Medicine

## 2021-08-16 ENCOUNTER — Ambulatory Visit
Admission: EM | Admit: 2021-08-16 | Discharge: 2021-08-16 | Disposition: A | Discharge status: Home / Self Care | Payer: Non-veteran care | Attending: Emergency Medicine | Admitting: Emergency Medicine

## 2021-08-16 DIAGNOSIS — H6121 Impacted cerumen, right ear: Secondary | ICD-10-CM | POA: Diagnosis not present

## 2021-08-16 DIAGNOSIS — H9201 Otalgia, right ear: Secondary | ICD-10-CM

## 2021-08-16 NOTE — Discharge Instructions (Addendum)
You can try Debrox to prevent future buildup.

## 2021-08-16 NOTE — ED Triage Notes (Signed)
C/op right ear pain and pressure x 7 days.

## 2021-08-16 NOTE — ED Provider Notes (Signed)
HPI  SUBJECTIVE:  Bruce Joseph is a 66 y.o. male who presents with 1 week of sharp, constant right ear pain that radiates down his jawline to the neck.  He reports right-sided sore throat.  No facial pain.  No external ear erythema, swelling, fevers, change in hearing, otorrhea, vertigo, dental pain, neck stiffness, drooling, trismus, voice changes, sensation of throat swelling shut. he denies foreign body insertion into the ear.  He went swimming a week ago, but denies getting his ear wet.  He tried 400 mg of ibuprofen with improvement in his symptoms.  He has also tried swimmer's ear, allergy and earwax removal drops without improvement.  Symptoms are worse after eating.  It is not associated with chewing or yawning.  No GERD symptoms.  He has a past medical history of GERD which is well controlled with omeprazole, tinnitus, hypertension.  No history of diabetes.  [facility]  Past Medical History:  Diagnosis Date   Anxiety    Erectile dysfunction    GERD (gastroesophageal reflux disease)    Hypertension     Past Surgical History:  Procedure Laterality Date   BACK SURGERY     lower back- disc repair   COLONOSCOPY  2013   cleared for 5 yrs- Dr Mechele Collin    Family History  Problem Relation Age of Onset   Hypertension Mother    Hypertension Brother    Hypertension Maternal Grandfather     Social History   Tobacco Use   Smoking status: Former    Types: Cigarettes    Quit date: 02/19/2018    Years since quitting: 3.4   Smokeless tobacco: Former   Tobacco comments:    gave info on smoking cessation options  Substance Use Topics   Alcohol use: Yes    Alcohol/week: 0.0 standard drinks of alcohol   Drug use: No    No current facility-administered medications for this encounter.  Current Outpatient Medications:    finasteride (PROSCAR) 5 MG tablet, Take 1 tablet (5 mg total) by mouth daily., Disp: 90 tablet, Rfl: 1   lisinopril-hydrochlorothiazide (ZESTORETIC) 10-12.5 MG  tablet, Take 1 tablet by mouth daily., Disp: 90 tablet, Rfl: 1   Multiple Vitamin (MULTI-VITAMINS) TABS, Take 1 tablet by mouth daily., Disp: , Rfl:    omeprazole (PRILOSEC) 20 MG capsule, Take 1 capsule (20 mg total) by mouth daily., Disp: 90 capsule, Rfl: 1   tadalafil (CIALIS) 20 MG tablet, Take 1 tablet (20 mg total) by mouth daily as needed., Disp: 5 tablet, Rfl: 11  No Known Allergies   ROS  As noted in HPI.   Physical Exam  BP (!) 155/98 (BP Location: Left Arm)   Pulse 71   Temp 98.7 F (37.1 C) (Oral)   Resp 18   SpO2 99%   Constitutional: Well developed, well nourished, no acute distress Eyes:  EOMI, conjunctiva normal bilaterally HENT: Normocephalic, atraumatic,mucus membranes moist.  Right external ear normal.  No pain with traction on pinna, palpation of tragus or mastoid.  No tenderness or crepitus over the right TMJ.  TM obscured with cerumen.  Left TM normal.  Normal tonsils without exudates, normal oropharynx. Lymph: No preauricular, postauricular, cervical lymphadenopathy. Respiratory: Normal inspiratory effort Cardiovascular: Normal rate GI: nondistended skin: No facial rash, skin intact Musculoskeletal: no deformities Neurologic: Alert & oriented x 3, no focal neuro deficits Psychiatric: Speech and behavior appropriate   ED Course   Medications - No data to display  Orders Placed This Encounter  Procedures  Ear wax removal    Right ear only    Standing Status:   Standing    Number of Occurrences:   1    No results found for this or any previous visit (from the past 24 hour(s)). No results found.  ED Clinical Impression  1. Hearing loss of right ear due to cerumen impaction   2. Otalgia, right      ED Assessment/Plan  Patient with cerumen impaction right ear.  Will irrigate and reevaluate  On reevaluation, patient states that the pain has resolved.  EAC normal, TM normal and intact.  Patient is post irrigation.  Symptoms have completely  resolved.  I suspect otalgia was from cerumen impaction.  There is no evidence of otitis externa, otitis media.  Follow-up with PCP as needed.   No orders of the defined types were placed in this encounter.     *This clinic note was created using Dragon dictation software. Therefore, there may be occasional mistakes despite careful proofreading.  ?    Domenick Gong, MD 08/16/21 1115

## 2024-03-14 NOTE — Progress Notes (Signed)
 Bruce Joseph                                          MRN: [id number]   03/14/2024   The [facility] Quality Team Specialist reviewed this patient medical record for the purposes of chart review for care gap closure. The following were reviewed: chart review for care gap closure-colorectal cancer screening.    [facility] Quality Team
